# Patient Record
Sex: Female | Born: 1935 | Race: White | Hispanic: No | Marital: Married | Smoking: Never smoker
Health system: Southern US, Community
[De-identification: ages and names within clinical notes are randomized; demographics above are authoritative.]

## PROBLEM LIST (undated history)

## (undated) DIAGNOSIS — IMO0001 Reserved for inherently not codable concepts without codable children: Secondary | ICD-10-CM

## (undated) DIAGNOSIS — K5790 Diverticulosis of intestine, part unspecified, without perforation or abscess without bleeding: Secondary | ICD-10-CM

## (undated) DIAGNOSIS — H269 Unspecified cataract: Secondary | ICD-10-CM

## (undated) DIAGNOSIS — E785 Hyperlipidemia, unspecified: Secondary | ICD-10-CM

## (undated) DIAGNOSIS — K649 Unspecified hemorrhoids: Secondary | ICD-10-CM

## (undated) DIAGNOSIS — F419 Anxiety disorder, unspecified: Secondary | ICD-10-CM

## (undated) DIAGNOSIS — E039 Hypothyroidism, unspecified: Secondary | ICD-10-CM

## (undated) DIAGNOSIS — K219 Gastro-esophageal reflux disease without esophagitis: Secondary | ICD-10-CM

## (undated) HISTORY — DX: Unspecified hemorrhoids: K64.9

## (undated) HISTORY — DX: Reserved for inherently not codable concepts without codable children: IMO0001

## (undated) HISTORY — DX: Unspecified cataract: H26.9

## (undated) HISTORY — DX: Diverticulosis of intestine, part unspecified, without perforation or abscess without bleeding: K57.90

## (undated) HISTORY — DX: Anxiety disorder, unspecified: F41.9

## (undated) HISTORY — DX: Hyperlipidemia, unspecified: E78.5

## (undated) HISTORY — PX: TOTAL ABDOMINAL HYSTERECTOMY: SHX209

## (undated) HISTORY — DX: Hypothyroidism, unspecified: E03.9

## (undated) HISTORY — PX: BILATERAL SALPINGOOPHORECTOMY: SHX1223

## (undated) HISTORY — DX: Gastro-esophageal reflux disease without esophagitis: K21.9

## (undated) HISTORY — PX: APPENDECTOMY: SHX54

---

## 1998-07-04 ENCOUNTER — Other Ambulatory Visit: Admission: RE | Admit: 1998-07-04 | Discharge: 1998-07-04 | Payer: Self-pay | Admitting: *Deleted

## 1999-10-25 ENCOUNTER — Encounter: Admission: RE | Admit: 1999-10-25 | Discharge: 1999-10-25 | Payer: Self-pay | Admitting: Obstetrics and Gynecology

## 1999-10-25 ENCOUNTER — Encounter: Payer: Self-pay | Admitting: Obstetrics and Gynecology

## 1999-11-22 ENCOUNTER — Other Ambulatory Visit: Admission: RE | Admit: 1999-11-22 | Discharge: 1999-11-22 | Payer: Self-pay | Admitting: Obstetrics and Gynecology

## 2000-02-20 ENCOUNTER — Encounter (INDEPENDENT_AMBULATORY_CARE_PROVIDER_SITE_OTHER): Payer: Self-pay | Admitting: *Deleted

## 2000-02-20 ENCOUNTER — Ambulatory Visit (HOSPITAL_BASED_OUTPATIENT_CLINIC_OR_DEPARTMENT_OTHER): Admission: RE | Admit: 2000-02-20 | Discharge: 2000-02-20 | Payer: Self-pay | Admitting: General Surgery

## 2000-08-12 ENCOUNTER — Encounter (INDEPENDENT_AMBULATORY_CARE_PROVIDER_SITE_OTHER): Payer: Self-pay | Admitting: Specialist

## 2000-08-12 ENCOUNTER — Inpatient Hospital Stay (HOSPITAL_COMMUNITY): Admission: RE | Admit: 2000-08-12 | Discharge: 2000-08-15 | Payer: Self-pay | Admitting: Obstetrics and Gynecology

## 2001-01-29 ENCOUNTER — Encounter: Payer: Self-pay | Admitting: Obstetrics and Gynecology

## 2001-01-29 ENCOUNTER — Encounter: Admission: RE | Admit: 2001-01-29 | Discharge: 2001-01-29 | Payer: Self-pay | Admitting: Obstetrics and Gynecology

## 2001-02-07 ENCOUNTER — Other Ambulatory Visit: Admission: RE | Admit: 2001-02-07 | Discharge: 2001-02-07 | Payer: Self-pay | Admitting: Obstetrics and Gynecology

## 2002-01-30 ENCOUNTER — Encounter: Payer: Self-pay | Admitting: Obstetrics and Gynecology

## 2002-01-30 ENCOUNTER — Encounter: Admission: RE | Admit: 2002-01-30 | Discharge: 2002-01-30 | Payer: Self-pay | Admitting: Obstetrics and Gynecology

## 2002-03-10 ENCOUNTER — Other Ambulatory Visit: Admission: RE | Admit: 2002-03-10 | Discharge: 2002-03-10 | Payer: Self-pay | Admitting: Obstetrics and Gynecology

## 2002-12-18 ENCOUNTER — Ambulatory Visit (HOSPITAL_COMMUNITY): Admission: RE | Admit: 2002-12-18 | Discharge: 2002-12-18 | Payer: Self-pay | Admitting: Internal Medicine

## 2004-07-14 ENCOUNTER — Encounter: Admission: RE | Admit: 2004-07-14 | Discharge: 2004-07-14 | Payer: Self-pay | Admitting: Internal Medicine

## 2004-07-31 ENCOUNTER — Ambulatory Visit: Payer: Self-pay | Admitting: Internal Medicine

## 2004-08-14 ENCOUNTER — Ambulatory Visit: Payer: Self-pay | Admitting: Internal Medicine

## 2004-10-25 ENCOUNTER — Other Ambulatory Visit: Admission: RE | Admit: 2004-10-25 | Discharge: 2004-10-25 | Payer: Self-pay | Admitting: Obstetrics and Gynecology

## 2004-11-01 ENCOUNTER — Encounter: Admission: RE | Admit: 2004-11-01 | Discharge: 2004-11-01 | Payer: Self-pay | Admitting: Obstetrics and Gynecology

## 2005-06-01 ENCOUNTER — Emergency Department (HOSPITAL_COMMUNITY): Admission: EM | Admit: 2005-06-01 | Discharge: 2005-06-02 | Payer: Self-pay | Admitting: Emergency Medicine

## 2005-06-11 ENCOUNTER — Ambulatory Visit: Payer: Self-pay | Admitting: Internal Medicine

## 2005-06-21 ENCOUNTER — Ambulatory Visit: Payer: Self-pay | Admitting: Internal Medicine

## 2005-10-08 ENCOUNTER — Ambulatory Visit: Payer: Self-pay

## 2007-03-19 ENCOUNTER — Encounter: Admission: RE | Admit: 2007-03-19 | Discharge: 2007-03-19 | Payer: Self-pay | Admitting: Obstetrics and Gynecology

## 2007-03-20 ENCOUNTER — Other Ambulatory Visit: Admission: RE | Admit: 2007-03-20 | Discharge: 2007-03-20 | Payer: Self-pay | Admitting: Obstetrics and Gynecology

## 2007-08-14 ENCOUNTER — Ambulatory Visit: Payer: Self-pay | Admitting: Internal Medicine

## 2007-09-11 ENCOUNTER — Ambulatory Visit: Payer: Self-pay | Admitting: Internal Medicine

## 2007-09-11 LAB — CONVERTED CEMR LAB
AST: 26 units/L (ref 0–37)
Albumin: 4.1 g/dL (ref 3.5–5.2)
Amylase: 85 units/L (ref 27–131)
Bacteria, UA: NEGATIVE
Basophils Relative: 0.6 % (ref 0.0–1.0)
Bilirubin Urine: NEGATIVE
CO2: 30 meq/L (ref 19–32)
Chloride: 105 meq/L (ref 96–112)
Creatinine, Ser: 1.2 mg/dL (ref 0.4–1.2)
Crystals: NEGATIVE
Glucose, Bld: 97 mg/dL (ref 70–99)
HCT: 41.8 % (ref 36.0–46.0)
Hemoglobin: 13.5 g/dL (ref 12.0–15.0)
Lymphocytes Relative: 32.9 % (ref 12.0–46.0)
MCHC: 32.3 g/dL (ref 30.0–36.0)
Monocytes Absolute: 0.4 10*3/uL (ref 0.2–0.7)
Neutro Abs: 2.6 10*3/uL (ref 1.4–7.7)
Neutrophils Relative %: 55.5 % (ref 43.0–77.0)
Nitrite: NEGATIVE
RDW: 12.9 % (ref 11.5–14.6)
Sodium: 140 meq/L (ref 135–145)
Total Bilirubin: 1 mg/dL (ref 0.3–1.2)
Total Protein, Urine: NEGATIVE mg/dL
Total Protein: 7 g/dL (ref 6.0–8.3)
Urobilinogen, UA: 0.2 (ref 0.0–1.0)

## 2007-09-26 ENCOUNTER — Ambulatory Visit: Payer: Self-pay

## 2008-10-11 ENCOUNTER — Encounter: Payer: Self-pay | Admitting: Obstetrics and Gynecology

## 2008-10-11 ENCOUNTER — Ambulatory Visit: Payer: Self-pay | Admitting: Obstetrics and Gynecology

## 2008-10-11 ENCOUNTER — Other Ambulatory Visit: Admission: RE | Admit: 2008-10-11 | Discharge: 2008-10-11 | Payer: Self-pay | Admitting: Obstetrics and Gynecology

## 2008-11-02 ENCOUNTER — Ambulatory Visit: Payer: Self-pay | Admitting: Obstetrics and Gynecology

## 2009-09-01 ENCOUNTER — Emergency Department (HOSPITAL_COMMUNITY)
Admission: EM | Admit: 2009-09-01 | Discharge: 2009-09-02 | Payer: Self-pay | Source: Home / Self Care | Admitting: Emergency Medicine

## 2010-08-05 ENCOUNTER — Encounter: Payer: Self-pay | Admitting: Internal Medicine

## 2010-10-05 LAB — POCT I-STAT, CHEM 8
BUN: 28 mg/dL — ABNORMAL HIGH (ref 6–23)
Calcium, Ion: 1.18 mmol/L (ref 1.12–1.32)
Chloride: 106 mEq/L (ref 96–112)
Glucose, Bld: 100 mg/dL — ABNORMAL HIGH (ref 70–99)

## 2010-10-05 LAB — DIFFERENTIAL
Basophils Absolute: 0 10*3/uL (ref 0.0–0.1)
Basophils Relative: 0 % (ref 0–1)
Eosinophils Relative: 3 % (ref 0–5)
Monocytes Absolute: 0.6 10*3/uL (ref 0.1–1.0)
Neutro Abs: 4.8 10*3/uL (ref 1.7–7.7)

## 2010-10-05 LAB — CBC
Hemoglobin: 12.9 g/dL (ref 12.0–15.0)
MCHC: 33.5 g/dL (ref 30.0–36.0)
Platelets: 219 10*3/uL (ref 150–400)
RDW: 14.2 % (ref 11.5–15.5)

## 2010-10-05 LAB — POCT CARDIAC MARKERS: CKMB, poc: 2.6 ng/mL (ref 1.0–8.0)

## 2010-11-28 NOTE — Assessment & Plan Note (Signed)
[facility]                         GASTROENTEROLOGY OFFICE NOTE   KEA, CALLAN                     MRN:          [id number]  DATE:08/14/2007                            DOB:          February 04, 1933    REFERRING PHYSICIAN:  Reuel Boom L. Eda Paschal, M.D.   REASON FOR CONSULTATION:  Abdominal discomfort.   HISTORY OF PRESENT ILLNESS:  This is a 77 year old female with a history  of hypothyroidism and diverticulosis, who is referred through the  courtesy of Dr. Eda Paschal regarding abdominal complaints. The patient  was evaluated in November of 2006 for abdominal complaints. See that  dictation for details. At that time, her symptoms were felt to be  anxiety related. Abdominal ultrasound was obtained and returned normal.  She has not been seen since. She has undergone prior colonoscopy in  2006. This revealed diverticulosis and hemorrhoids. At this time, she  reports to me a several month history of a fullness-like sensation in  the abdomen. She notices it in the upper abdomen and then lower abdomen,  particularly when standing. She states it feels like prolapse. However,  she does describe true prolapse. Over the past few weeks, she has  noticed symptoms a couple of times per week. They generally last all  day. She cannot identify any exacerbating or relieving factors such as  meals, defecation, or change in both position. She does feel that  symptoms may be less noticeable after activity, such as tennis. She has  had some weight gain. She denies nausea, vomiting, dysphagia,  constipation, diarrhea, melena or hematochezia. No fevers. The  discomfort does not radiate into the back or groin. No bladder  complaints. She does mention to me that she has been having some stress,  some difficulty sleeping. 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  However, after falling to sleep, no abdominal complaints.   PAST MEDICAL HISTORY:  Hypothyroidism.   PAST  SURGICAL HISTORY:  1. Hysterectomy.  2. Appendectomy.   ALLERGIES:  SULFA.   CURRENT MEDICATIONS:  Synthroid 0.05 mg daily, multivitamin,  chondroitin, and Ambien p.r.n.   FAMILY HISTORY:  Negative for gastrointestinal malignancy.   SOCIAL HISTORY:  The patient is married with 3 children. She lives with  her husband. She has a Chief Operating Officer degree in art history and has worked  part-time as an Network engineer. She does not smoke and occasionally  uses alcohol.   PHYSICAL EXAMINATION:  GENERAL:  A well appearing female in no acute  distress. Alert and oriented.  VITAL SIGNS:  Blood pressure 100/60, heart rate 88, weight 126.8 pounds.  No change from November 2006.  HEENT:  Sclerae anicteric. Conjunctivae pink. Oral mucosa intact. No  thrush.  LUNGS:  Clear.  HEART:  Regular.  ABDOMEN:  Soft with complaints of tenderness on the abdominal wall to  palpation in the mid portion of the abdomen throughout. No mass felt.  Good bowel sounds heard.  EXTREMITIES:  Without edema.   IMPRESSION:  This is a 77 year old female who presents with vague  abdominal discomfort. I suspect her symptoms are functional and  secondary to stress, as was the case  previously. However, we should  pursue some additional workup to be certain as well as be able to  provide some level of reassurance.   RECOMMENDATIONS:  1. Screening laboratories toady including CBC, comprehensive metabolic      panel, amylase and lipase.  2. Urinalysis.  3. Upper endoscopy to exclude ulcer disease. The nature of the      procedure of the risks, benefits, and alternatives have been      reviewed. She understood and agreed to proceed.  4. Ongoing general medical care with Dr. Jacky Kindle and gynecologic care      with Dr. Eda Paschal.     Wilhemina Bonito. Marina Goodell, MD  Electronically Signed    JNP/MedQ  DD: 08/14/2007  DT: 08/15/2007  Job #: [id number]   cc:   Geoffry Paradise, M.D.

## 2010-12-01 NOTE — H&P (Signed)
[facility]  Patient:    Ruth Porter, Ruth Porter                       MRN: [id number] Attending:  Rande Brunt. Eda Paschal, M.D.                         History and Physical  CHIEF COMPLAINT:  Symptomatic fibroids.  HISTORY OF PRESENT ILLNESS:  Patient is a 77 year old gravida 3, para 3, AB0 who is getting progressively worse symptoms from her fibroids that are secondary to hormonal replacement stimulation.  They consist of menorrhagia, severe dysmenorrhea, and pain on her right lower quadrant that radiates to her legs.  Ultrasound has confirmed multiple myomas, the largest of which is almost 5 cm.  We have tried to alter hormonal replacement therapy but this has not been successful.  Patient is doing well on hormones and when she tried to discontinue the hormones so she would not have to put up with the above she felt terrible off of them and therefore she wants to stay on her estrogen both for symptom relief as well as prevention.  As a result of all of the above she now enters the hospital for a vaginal hysterectomy.  She would like to have a bilateral salpingo-oophorectomy because of her age and I think that is appropriate.  She would like me to take out her ovaries even if that requires an incision.  PAST MEDICAL HISTORY:  Patient is hypothyroid on Synthroid.  She is also on hormonal replacement therapy.  ALLERGIES:  None.  PAST SURGICAL HISTORY:  Appendectomy at the time of exploratory laparotomy in 1963.  SOCIAL HISTORY:  She is a nonsmoker.  She drinks alcohol in small doses.  REVIEW OF SYSTEMS:  HEENT:  Negative.  CARDIAC:  Negative.  RESPIRATORY: Negative.  GI:  Negative.  GU:  Negative.  NEUROMUSCULAR:  Negative. ENDOCRINE:  History of hypothyroidism.  All else is negative.  FAMILY HISTORY:  Grandmother had uterine cancer.  Mother and grandfather both have coronary artery disease.  PHYSICAL EXAMINATION:  GENERAL:  Patient is a well-developed,  well-nourished female in no acute distress.  VITAL SIGNS:  Blood pressure 130/70, pulse 80 and regular, respirations 16 and nonlabored.  She is afebrile.  HEENT:  All within normal limits.  NECK:  Supple.  Trachea in the midline.  Thyroid is not enlarged.  LUNGS:  Clear to P&A.  HEART:  No thrills, heaves, or murmurs.  BREASTS:  No masses.  ABDOMEN:  Soft without guarding, rebound, or masses.  PELVIC:  External and vaginal is within normal limits.  Cervix is clean.  Pap shows no atypia.  Uterus is enlarged by myomas to 9 weeks size.  Adnexa is palpably normal.  RECTAL:  Negative.  IMPRESSION:  Symptomatic fibroids.  PLAN:  Vaginal hysterectomy, bilateral salpingo-oophorectomy. DD:  08/12/00 TD:  08/12/00 Job: [id number]

## 2010-12-01 NOTE — Discharge Summary (Signed)
[facility]  Patient:    Ruth Porter, Ruth Porter                   MRN: [id number] Adm. Date:  [id number] Disc. Date: 08/15/00 Attending:  Sharon Mt                           Discharge Summary  HISTORY OF PRESENT ILLNESS:  The patient is a 77 year old female with fibroids who continued to have postmenopausal bleeding and pain and, therefore, entered the hospital for surgery.  HOSPITAL COURSE:  On the day of admission she underwent a vaginal hysterectomy, bilateral salpingo-oophorectomy.  She had an ileus postoperatively that finally responded to IVs and restriction of fluids.  By the third postoperative day she was ready for discharge.  DISCHARGE MEDICATIONS: 1. Tylox for pain relief. 2. Ferrous sulfate 325 mg daily for iron replacement.  FOLLOW-UP:  She will be seen in the office in three weeks.  ACTIVITY:  Ambulatory.  DIET:  Regular.  PATHOLOGY:  Final pathology report revealed uterus with leiomyoma intramural, adenomyosis, simplex and complex hyperplasia without atypia.  Ovaries and fallopian tubes without any pathological diagnosis.  CONDITION ON DISCHARGE:  Improved.  DISCHARGE DIAGNOSES: 1. Persistent postmenopausal bleeding. 2. Leiomyomata uteri. 3. Adenomyosis. 4. Simplex and complex hyperplasia without atypia of the endometrium.  OPERATIONS:  Vaginal hysterectomy, bilateral salpingo-oophorectomy.DD: 08/15/00 TD:  08/15/00 Job: [id number]

## 2010-12-01 NOTE — Op Note (Signed)
[facility]  Patient:    Ruth Porter, Ruth Porter                  MRN: [id number] Proc. Date: 08/12/00 Attending:  Rande Brunt. Eda Paschal, M.D.                           Operative Report  PREOPERATIVE DIAGNOSIS:  Symptomatic fibroids.  POSTOPERATIVE DIAGNOSIS:  Symptomatic fibroids.  OPERATION PERFORMED:  Vaginal hysterectomy, bilateral salpingo-oophorectomy.  SURGEON:  Dr. Eda Paschal.  FIRST ASSISTANT:  Dr. Lily Peer.  ANESTHESIA:  General endotracheal.  FINDINGS:  The patients uterus was enlarged by multiple myomas to 9 and 10 week size, ovaries fallopian tubes and pelvic peritoneum were free of any disease.  DESCRIPTION OF PROCEDURE:  After adequate general endotracheal anesthesia, the patient was placed in the dorsal lithotomy position, prepped and draped in the usual sterile manner. A 1:200,000 solution of epinephrine was injected around the cervix and 0.5% xylocaine. A 360 degree incision was made around the cervix. The bladder was mobilized superiorly as was the posterior peritoneum. Initially it was difficult to get into the vesicouterine fold to peritoneum because the patient had a small myoma on the serosal surface of the uterus in the midline anteriorly. Finally this area could be dissected above without entering the bladder. The vesicouterine fold to peritoneum was sharply entered. The posterior peritoneum was sharply entered. The uterosacral ligaments were clamped and then they were shortened and they were sutured to the vaginal cuff laterally for good vault support. The cardinal ligaments, uterine arteries, and most of the broad ligaments were clamped, cut and suture ligated. Suture material for all the above mentioned pedicles was #1 chromic catgut. The uterus was partially delivered. It required both morcellation and myomectomies in order to shrink the uterus enough to get it out through the introitus but it finally was accomplished and  then the balance of the broad ligaments, utero-ovarian ligaments, round ligaments, and fallopian tubes were clamped, cut and suture ligated with #1 chromic catgut. All the pieces of the myoma and the uterus were sent to pathology for tissue diagnosis. The adnexa were then identified and the infundibulopelvic ligaments were clamped, cut, and doubly suture ligated with #1 chromic catgut. They were also sent to pathology for tissue diagnosis. At this point, we encountered some fairly brisk bleeding. We were concerned about the pedicles. As the surgery proceeded, it turned out that the pedicles were all intact and it was all just vaginal cuff bleeding. Using interrupted figure-of-eights of #1 chromic catgut, the peritoneum was suture ligated in about 4 or 5 spots to stop the bleeding and then the remainder of the vaginal cuff was whip stitched with a running locking #0 Vicryl. A modified McCall enterocele suture was placed with 2-0 Vicryl. At this point, the bleeding had been completely controlled and it stopped. The cuff was closed with figure-of-eights of #1 chromic catgut. At the termination of the procedure, a Foley catheter was inserted and draining clear urine. Estimated blood loss was 450 cc with none replaced. The patient tolerated the procedure well and left the operating room in satisfactory condition. DD:  08/12/00 TD:  08/12/00 Job: [id number]

## 2013-06-18 ENCOUNTER — Other Ambulatory Visit: Payer: Self-pay

## 2014-09-21 ENCOUNTER — Ambulatory Visit: Payer: Self-pay | Admitting: Internal Medicine

## 2015-06-17 ENCOUNTER — Encounter: Payer: Self-pay | Admitting: Internal Medicine

## 2019-08-09 ENCOUNTER — Ambulatory Visit: Payer: Self-pay

## 2020-12-03 ENCOUNTER — Encounter (HOSPITAL_COMMUNITY): Payer: Self-pay

## 2020-12-03 ENCOUNTER — Other Ambulatory Visit: Payer: Self-pay

## 2020-12-03 ENCOUNTER — Ambulatory Visit (HOSPITAL_COMMUNITY)
Admission: EM | Admit: 2020-12-03 | Discharge: 2020-12-03 | Disposition: A | Discharge status: Home / Self Care | Payer: Medicare (Managed Care)

## 2020-12-03 DIAGNOSIS — R531 Weakness: Secondary | ICD-10-CM | POA: Diagnosis not present

## 2020-12-03 DIAGNOSIS — R079 Chest pain, unspecified: Secondary | ICD-10-CM

## 2020-12-03 DIAGNOSIS — R42 Dizziness and giddiness: Secondary | ICD-10-CM | POA: Diagnosis not present

## 2020-12-03 DIAGNOSIS — R6884 Jaw pain: Secondary | ICD-10-CM | POA: Diagnosis not present

## 2020-12-03 DIAGNOSIS — Z751 Person awaiting admission to adequate facility elsewhere: Secondary | ICD-10-CM

## 2020-12-03 NOTE — ED Notes (Signed)
Patient is being discharged from the Urgent Care and sent to the Emergency Department via POV. Per Fleet Contras, [place], patient is in need of higher level of care due to complaint/tia. Patient is aware and verbalizes understanding of plan of care.  Vitals:   12/03/20 1619  BP: 135/87  Pulse: 78  Resp: 18  Temp: 98.2 F (36.8 C)  SpO2: 94%

## 2020-12-03 NOTE — ED Provider Notes (Signed)
Patient sent to ED for further evaluation given the nature of her symptoms.  She will have someone transport her via private vehicle.  She declines EMS transport and is hemodynamically stable for private vehicle transport.   Particia Nearing, [place] 12/03/20 1652

## 2020-12-03 NOTE — ED Triage Notes (Signed)
Pt reports that she totally lost strength in her legs at 0830.  States legs completely went out, had no strength at that time and caught self in a chair. States symptoms then resolved on their own in 10 minutes. Also reports one or two dizzy spells over the last 6 months with most recent episode being more than 2 weeks ago. Pt denies any weakness or dizziness at this time.   States was having slight chest pain with right side jaw pain this morning. Pt reports that is on amoxicillin for dental issue on this side however. Pt denies chest pain at this time.  Pt also reports being sick with a cold last week but that she is feeling better.  Reports she has upcoming dental surgery on Tuesday.

## 2020-12-03 NOTE — ED Notes (Signed)
Discharged patient from epic only/this nurse was not involved in patient's care

## 2020-12-14 ENCOUNTER — Emergency Department (HOSPITAL_BASED_OUTPATIENT_CLINIC_OR_DEPARTMENT_OTHER): Payer: Medicare (Managed Care)

## 2020-12-14 ENCOUNTER — Encounter (HOSPITAL_BASED_OUTPATIENT_CLINIC_OR_DEPARTMENT_OTHER): Payer: Self-pay | Admitting: Emergency Medicine

## 2020-12-14 ENCOUNTER — Other Ambulatory Visit: Payer: Self-pay

## 2020-12-14 ENCOUNTER — Emergency Department (HOSPITAL_BASED_OUTPATIENT_CLINIC_OR_DEPARTMENT_OTHER)
Admission: EM | Admit: 2020-12-14 | Discharge: 2020-12-14 | Disposition: A | Discharge status: Home / Self Care | Payer: Medicare (Managed Care) | Attending: Emergency Medicine | Admitting: Emergency Medicine

## 2020-12-14 DIAGNOSIS — Z20822 Contact with and (suspected) exposure to covid-19: Secondary | ICD-10-CM | POA: Insufficient documentation

## 2020-12-14 DIAGNOSIS — Z79899 Other long term (current) drug therapy: Secondary | ICD-10-CM | POA: Diagnosis not present

## 2020-12-14 DIAGNOSIS — R531 Weakness: Secondary | ICD-10-CM | POA: Diagnosis present

## 2020-12-14 DIAGNOSIS — E039 Hypothyroidism, unspecified: Secondary | ICD-10-CM | POA: Insufficient documentation

## 2020-12-14 DIAGNOSIS — R5383 Other fatigue: Secondary | ICD-10-CM | POA: Insufficient documentation

## 2020-12-14 DIAGNOSIS — R42 Dizziness and giddiness: Secondary | ICD-10-CM | POA: Insufficient documentation

## 2020-12-14 LAB — CBC WITH DIFFERENTIAL/PLATELET
Abs Immature Granulocytes: 0.01 10*3/uL (ref 0.00–0.07)
Basophils Absolute: 0 10*3/uL (ref 0.0–0.1)
Basophils Relative: 1 %
Eosinophils Absolute: 0.2 10*3/uL (ref 0.0–0.5)
Eosinophils Relative: 3 %
HCT: 38.4 % (ref 36.0–46.0)
Hemoglobin: 12.4 g/dL (ref 12.0–15.0)
Immature Granulocytes: 0 %
Lymphocytes Relative: 24 %
Lymphs Abs: 1.3 10*3/uL (ref 0.7–4.0)
MCH: 27.1 pg (ref 26.0–34.0)
MCHC: 32.3 g/dL (ref 30.0–36.0)
MCV: 83.8 fL (ref 80.0–100.0)
Monocytes Absolute: 0.5 10*3/uL (ref 0.1–1.0)
Monocytes Relative: 9 %
Neutro Abs: 3.5 10*3/uL (ref 1.7–7.7)
Neutrophils Relative %: 63 %
Platelets: 236 10*3/uL (ref 150–400)
RBC: 4.58 MIL/uL (ref 3.87–5.11)
RDW: 14.5 % (ref 11.5–15.5)
WBC: 5.4 10*3/uL (ref 4.0–10.5)
nRBC: 0 % (ref 0.0–0.2)

## 2020-12-14 LAB — COMPREHENSIVE METABOLIC PANEL
ALT: 13 U/L (ref 0–44)
AST: 19 U/L (ref 15–41)
Albumin: 3.6 g/dL (ref 3.5–5.0)
Alkaline Phosphatase: 73 U/L (ref 38–126)
Anion gap: 8 (ref 5–15)
BUN: 19 mg/dL (ref 8–23)
CO2: 26 mmol/L (ref 22–32)
Calcium: 8.6 mg/dL — ABNORMAL LOW (ref 8.9–10.3)
Chloride: 105 mmol/L (ref 98–111)
Creatinine, Ser: 0.92 mg/dL (ref 0.44–1.00)
GFR, Estimated: >60 mL/min (ref >60)
Glucose, Bld: 90 mg/dL (ref 70–99)
Potassium: 3.7 mmol/L (ref 3.5–5.1)
Sodium: 139 mmol/L (ref 135–145)
Total Bilirubin: 0.4 mg/dL (ref 0.3–1.2)
Total Protein: 5.8 g/dL — ABNORMAL LOW (ref 6.5–8.1)

## 2020-12-14 LAB — URINALYSIS, ROUTINE W REFLEX MICROSCOPIC
Bilirubin Urine: NEGATIVE
Glucose, UA: NEGATIVE mg/dL
Hgb urine dipstick: NEGATIVE
Ketones, ur: NEGATIVE mg/dL
Leukocytes,Ua: NEGATIVE
Nitrite: NEGATIVE
Protein, ur: NEGATIVE mg/dL
Specific Gravity, Urine: 1.01 (ref 1.005–1.030)
pH: 5 (ref 5.0–8.0)

## 2020-12-14 LAB — MAGNESIUM: Magnesium: 1.9 mg/dL (ref 1.7–2.4)

## 2020-12-14 LAB — RESP PANEL BY RT-PCR (FLU A&B, COVID) ARPGX2
Influenza A by PCR: NEGATIVE
Influenza B by PCR: NEGATIVE
SARS Coronavirus 2 by RT PCR: NEGATIVE

## 2020-12-14 LAB — TROPONIN I (HIGH SENSITIVITY): Troponin I (High Sensitivity): 4 ng/L (ref <18)

## 2020-12-14 NOTE — Discharge Instructions (Signed)
We talked about the importance of following up with a cardiologist this week.   Their office will reach out to you to arrange this follow up. I advised that you start taking 81 mg aspirin every day, which you said you have at home.   You were diagnosed with chest pain today.  This is a non-specific diagnosis, but your provider did not feel this was a life-threatening condition at this time.  It is not uncommon for patients to leave the emergency department without a specific diagnosis, as our tests are diagnosed to recognize life-threatening conditions only.  It is important to remember that your workup today was not a complete medical workup.  You may still have a serious medical attention that needs follow up care with a specialist. If your provider referred you to see a specialist, it is VERY important that you call to set up an appointment with them.    It is also important that you speak to your primary care provider in 1-2 days about this visit to the ER.  Your PCP may want to see you in the office, or else they may want you to follow up with the specialist.  SEEK IMMEDIATE MEDICAL ATTENTION IF:  You have severe chest pain, especially if the pain is crushing or pressure-like and spreads to the arms, back, neck, or jaw, or if you have sweating, nausea (feeling sick to your stomach), or shortness of breath. THIS IS AN EMERGENCY. Don't wait to see if the pain will go away. Get medical help at once. Call 911 or 0 (operator). DO NOT drive yourself to the hospital.   Your chest pain gets worse and does not go away with rest.  You have an attack of chest pain lasting longer than usual, despite rest and treatment with the medications your caregiver has prescribed.  You wake from sleep with chest pain or shortness of breath.  You feel dizzy or faint.  You have chest pain not typical of your usual pain for which you originally saw your caregiver.

## 2020-12-14 NOTE — ED Provider Notes (Signed)
[facility] EMERGENCY DEPT Provider Note   CSN: [id number] Arrival date & time: 12/14/20  1614     History Chief Complaint  Patient presents with  . Dizziness    Ruth Porter is a 87 y.o. female present emergency department generalized fatigue.  Patient reports that she has had several weeks of intermittent symptoms of fatigue, weakness, "feeling bad."  Her symptoms tend to wax and wane.  She went to an UC 2 weeks ago, and they advised an ED visit for blood tests, but she opted not to do this due to long wait times.  She feels tired today.  Mild vertigo (hx of this) and some lightheadedness with standing.    Denies dysuria, hematuria, fever, cough, congestion, diarrhea, chest pain, chest pressure, SOB.  Denies personal hx of MI or cardiac disease, or hx of smoking, HLD, diabetes.  She has a family hx of heart disease.  Here with her daughter at bedside  HPI     Past Medical History:  Diagnosis Date  . Anxiety   . Cataract    right  . Diverticulosis   . Hemorrhoids   . Hyperlipemia   . Hypothyroidism   . Reflux     There are no problems to display for this patient.   Past Surgical History:  Procedure Laterality Date  . APPENDECTOMY    . BILATERAL SALPINGOOPHORECTOMY    . TOTAL ABDOMINAL HYSTERECTOMY       OB History   No obstetric history on file.     Family History  Problem Relation Age of Onset  . Heart attack Mother   . Stroke Mother     Social History   Tobacco Use  . Smoking status: Never Smoker  . Smokeless tobacco: Never Used  Substance Use Topics  . Alcohol use: Not Currently  . Drug use: Not Currently    Home Medications Prior to Admission medications   Medication Sig Start Date End Date Taking? Authorizing Provider  cholecalciferol (VITAMIN D3) 25 MCG (1000 UNIT) tablet Take 5,000 Units by mouth daily.    [provider]  dexlansoprazole (DEXILANT) 60 MG capsule Take 60 mg by mouth daily.    [provider]  escitalopram (LEXAPRO) 5 MG tablet Take 5 mg by mouth daily.    [provider]  levothyroxine (SYNTHROID) 25 MCG tablet Take 25 mcg by mouth daily before breakfast.    [provider]  Multiple Vitamins-Minerals (CENTRUM ULTRA WOMENS PO) Take by mouth.    [provider]  Multiple Vitamins-Minerals (PRESERVISION/LUTEIN PO) Take by mouth.    [provider]    Allergies    Sulfa antibiotics  Review of Systems   Review of Systems  Constitutional: Positive for fatigue. Negative for chills and fever.  HENT: Negative for ear pain and sore throat.   Eyes: Negative for pain and visual disturbance.  Respiratory: Negative for cough and shortness of breath.   Cardiovascular: Negative for chest pain and palpitations.  Gastrointestinal: Negative for abdominal pain and vomiting.  Genitourinary: Negative for dysuria and hematuria.  Musculoskeletal: Negative for arthralgias and back pain.  Skin: Negative for color change and rash.  Neurological: Positive for light-headedness. Negative for syncope.  All other systems reviewed and are negative.   Physical Exam Updated Vital Signs BP (!) 163/91 (BP Location: Right Arm)   Pulse 66   Temp 99 F (37.2 C) (Oral)   Resp 19   Ht 5\' 3"  (1.6 m)   Wt 55.8 kg  SpO2 99%   BMI 21.79 kg/m   Physical Exam Constitutional:      General: She is not in acute distress. HENT:     Head: Normocephalic and atraumatic.  Eyes:     Conjunctiva/sclera: Conjunctivae normal.     Pupils: Pupils are equal, round, and reactive to light.  Cardiovascular:     Rate and Rhythm: Normal rate and regular rhythm.  Pulmonary:     Effort: Pulmonary effort is normal. No respiratory distress.  Abdominal:     General: There is no distension.     Tenderness: There is no abdominal tenderness.  Skin:    General: Skin is warm and dry.  Neurological:     General: No focal deficit present.     Mental Status: She is alert.  Mental status is at baseline.     GCS: GCS eye subscore is 4. GCS verbal subscore is 5. GCS motor subscore is 6.     Cranial Nerves: Cranial nerves are intact.     Sensory: Sensation is intact.     Motor: Motor function is intact.     Coordination: Coordination is intact.     Gait: Gait is intact.  Psychiatric:        Mood and Affect: Mood normal.        Behavior: Behavior normal.     ED Results / Procedures / Treatments   Labs (all labs ordered are listed, but only abnormal results are displayed) Labs Reviewed  COMPREHENSIVE METABOLIC PANEL - Abnormal; Notable for the following components:      Result Value   Calcium 8.6 (*)    Total Protein 5.8 (*)    All other components within normal limits  RESP PANEL BY RT-PCR (FLU A&B, COVID) ARPGX2  CBC WITH DIFFERENTIAL/PLATELET  URINALYSIS, ROUTINE W REFLEX MICROSCOPIC  MAGNESIUM  TROPONIN I (HIGH SENSITIVITY)    EKG EKG Interpretation  Date/Time:  Wednesday December 14 2020 16:30:20 EDT Ventricular Rate:  79 PR Interval:  190 QRS Duration: 93 QT Interval:  369 QTC Calculation: 423 R Axis:   42 Text Interpretation: Sinus rhythm Ventricular premature complex Low voltage, precordial leads No STEMI Confirmed by Alvester Chou [phone]) on 12/14/2020 4:34:52 PM   Radiology DG Chest Portable 1 View  Result Date: 12/14/2020 CLINICAL DATA:  Dizziness EXAM: PORTABLE CHEST 1 VIEW COMPARISON:  None. FINDINGS: Heart and mediastinal contours are within normal limits. No focal opacities or effusions. No acute bony abnormality. IMPRESSION: No active disease. Electronically Signed   By: Charlett Nose M.D.   On: 12/14/2020 17:29    Procedures Procedures   Medications Ordered in ED Medications - No data to display  ED Course  I have reviewed the triage vital signs and the nursing notes.  Pertinent labs & imaging results that were available during my care of the patient were reviewed by me and considered in my medical decision making (see  chart for details).  87 yo female here with nonspecific symptoms for several weeks, primarily fatigue, weakness, and "feeling bad."  No specific pain complaints.  No chest discomfort.  Ddx is broad and would include atypical ACS vs anemia vs dehydration vs infection including covid, UTI, or other vs other  Labs reviewed and unremarkable.  No evidence of anemia, dehydration, ACS, covid, UTI.  WBC normal.  Hgb normal.  ECG reviewed personally with NSR, no acute ischemic findings or significant irregularities. DG chest reviewed - no focal findings  She has a benign neurological exam - no evidence  of stroke at this time.  Additional history provided by daughter at bedside.  Her daughter spoke with their family cardiologist who subsequently called me, and told me her office in [place] would arrange for a first-time visit this week with the patient, and advised that we begin her on 81 mg aspirin daily empirically until further cardiac workup could be obtained.  I think this is reasonable.  I reassessed the patient and she had no new complaints.  She was able to ambulate steadily in the ED.  At this time I do not see a clinical indication for hospitalization, and I believe she is reasonably safe for discharge home.  We discussed fall precautions - she has a walker, and close outpatient follow up.  Both the patient and her daughter were in agreement with this plan and had no further questions for me.  Clinical Course as of 12/14/20 2356  Wed Dec 14, 2020  2951 Patient will have cardiologist appointment at the end of the week.  Okay to discharge [MT]    Clinical Course User Index [MT] Sutter Ahlgren, Kermit Balo, MD    Final Clinical Impression(s) / ED Diagnoses Final diagnoses:  Weakness    Rx / DC Orders ED Discharge Orders    None       Renaye Rakers Kermit Balo, MD 12/14/20 2356

## 2020-12-14 NOTE — ED Triage Notes (Signed)
Pt arrives to ED with c/o dizziness and lightheadedness that's started this morning. Pt reports minor procedure to left eye yesterday that required IV dye and shot to the left eye for macular degeneration.  Pt reports unsteady gait.

## 2020-12-14 NOTE — ED Notes (Signed)
Labs drawn and on hold in lab

## 2020-12-14 NOTE — ED Notes (Signed)
Complains of dizziness weakness and generally "feeling" bad.  States onset two weeks.  Weakness in legs bilaterally but not at present.  Denies SOB or chest pain.

## 2020-12-15 ENCOUNTER — Telehealth: Payer: Self-pay | Admitting: *Deleted

## 2020-12-15 NOTE — Telephone Encounter (Signed)
Message from Dr. Tenny Craw - pt has been at hospital for weakness, needs follow up in cardiology and ok to add in tomorrow afternoon (DOD day).  Called pt.  Left message to call back.  She can be added in at 1:00 pm.  Will await return call.

## 2020-12-16 ENCOUNTER — Encounter: Payer: Self-pay | Admitting: Internal Medicine

## 2020-12-16 ENCOUNTER — Ambulatory Visit: Payer: Medicare (Managed Care) | Admitting: Internal Medicine

## 2020-12-16 ENCOUNTER — Other Ambulatory Visit: Payer: Self-pay

## 2020-12-16 VITALS — BP 122/72 | HR 75 | Ht 63.0 in | Wt 125.6 lb

## 2020-12-16 DIAGNOSIS — R531 Weakness: Secondary | ICD-10-CM | POA: Diagnosis not present

## 2020-12-16 NOTE — Progress Notes (Signed)
Cardiology Office Note   Date:  12/17/2020   ID:  Ruth Porter, Ruth Porter 1932-09-07, MRN [id number]  PCP:  Ruth Paradise, MD  Cardiologist:   Ruth Pates, MD   Pt referred as f/u for weakness   History of Present Illness: Ruth Porter is a 87 y.o. female with hx of  Hypothyroidism.   No known CAD    The pt presents today on f/u from ER visit on 12/14/20  The pt says she was doing OK  Until early May 2022  Says she developed a viral type infection. Was recovering.   On 5/21 she was seen in Urgent care.  That morning she became weak in legs, no strength   Resolved  ALso complained of some chest pressure and R jaw pain  Being treated for dental problem, on amoxicillin. Pt did not go to ED for further eval On 6/1 she went to ED for complaints of fatigue. BP 163/   Noted mild dizziness  Lab work normal (trop, UA).  EKG unremarkable   Pt sent out  Talking to the pt she says she has been very active but says she has gradually slowed.   More acute now. SHe also notes intermitt chest discomfort for years.    Had stress test years ago (medicine given)   Says the test was horrible.    Pt says both mother and sister had signif CAD      Current Meds  Medication Sig  . aspirin EC 81 MG tablet Take 81 mg by mouth daily. Swallow whole.  . Cholecalciferol (VITAMIN D) 125 MCG (5000 UT) CAPS Take 5,000 Units by mouth daily.  Marland Kitchen escitalopram (LEXAPRO) 10 MG tablet Take 0.5 tablets by mouth daily.  Marland Kitchen levothyroxine (SYNTHROID) 50 MCG tablet Take 50 mcg by mouth daily.  . Multiple Vitamins-Minerals (CENTRUM ULTRA WOMENS PO) Take by mouth.  . Multiple Vitamins-Minerals (PRESERVISION/LUTEIN PO) Take by mouth.     Allergies:   Sulfa antibiotics   Past Medical History:  Diagnosis Date  . Anxiety   . Cataract    right  . Diverticulosis   . Hemorrhoids   . Hyperlipemia   . Hypothyroidism   . Reflux     Past Surgical History:  Procedure Laterality Date  . APPENDECTOMY    . BILATERAL  SALPINGOOPHORECTOMY    . TOTAL ABDOMINAL HYSTERECTOMY       Social History:  The patient  reports that she has never smoked. She has never used smokeless tobacco. She reports previous alcohol use. She reports previous drug use.   Family History:  The patient's family history includes Heart attack in her mother; Stroke in her mother.    ROS:  Please see the history of present illness. All other systems are reviewed and  Negative to the above problem except as noted.    PHYSICAL EXAM: VS:  BP 122/72   Pulse 75   Ht 5\' 3"  (1.6 m)   Wt 125 lb 9.6 oz (57 kg)   SpO2 96%   BMI 22.25 kg/m    Orthostatics:  BP 106/88 P 72 (laying);  110/72  P 76 (sitting)   120/70 P 80  (standing)   118/76 P 80 (standing 4 min)  GEN: Well nourished, well developed, in no acute distress  HEENT: normal  Neck: no JVD, carotid bruits, or masses Cardiac: RRR; no murmurs, rubs, or gallops,no edema  Respiratory:  clear to auscultation bilaterally, normal work of breathing GI: soft, nontender, nondistended, +  BS  No hepatomegaly  MS: no deformity Moving all extremities   Skin: warm and dry, no rash Neuro:  Strength and sensation are intact Psych: euthymic mood, full affect   EKG:  EKG is not ordered today.   Lipid Panel No results found for: CHOL, TRIG, HDL, CHOLHDL, VLDL, LDLCALC, LDLDIRECT    Wt Readings from Last 3 Encounters:  12/16/20 125 lb 9.6 oz (57 kg)  12/14/20 123 lb (55.8 kg)      ASSESSMENT AND PLAN:  Pt is an 87 yo with hx of weakness, fatigue    Also intermitt chest discomfort   SYmptoms are not completely typical (chest discomfort not tied to activity)   But, not able to explan otherwise in a 87 yo woman who otherwise is without signif medical problems, used to being activity.   COncern for CAD given Hx of both mother and sister with significant CAD  Reocmm:   WIll check TSH to r/o inadequant replacement therapy. With profound reaction to previous myoview in 2011, will arrange for  CT coronary angiogram to eval coronary anatomy To this point take activity as tolerated  2  Thyroid  Check TSH  3  Lipids   LDL  119  HDL 50  Trig 89   Follow for now      Current medicines are reviewed at length with the patient today.  The patient does not have concerns regarding medicines.  Signed, Ruth Pates, MD  12/17/2020 8:53 AM    [facility] [address] [place], [place], [place]  [address] Phone: [phone]; Fax: [phone]

## 2020-12-16 NOTE — Patient Instructions (Signed)
Medication Instructions:  No changes *If you need a refill on your cardiac medications before your next appointment, please call your pharmacy*   Lab Work: Today: TSH  If you have labs (blood work) drawn today and your tests are completely normal, you will receive your results only by: Marland Kitchen MyChart Message (if you have MyChart) OR . A paper copy in the mail If you have any lab test that is abnormal or we need to change your treatment, we will call you to review the results.   Testing/Procedures: Dr. Tenny Craw recommends cardiac CT angiogram.  We will call you with instructions once appointment is scheduled.   Follow-Up: As needed.  Other Instructions

## 2020-12-17 LAB — TSH: TSH: 1.87 u[IU]/mL (ref 0.450–4.500)

## 2020-12-26 ENCOUNTER — Encounter: Payer: Self-pay | Admitting: *Deleted

## 2020-12-26 ENCOUNTER — Other Ambulatory Visit: Payer: Self-pay | Admitting: *Deleted

## 2020-12-26 DIAGNOSIS — R072 Precordial pain: Secondary | ICD-10-CM

## 2020-12-26 MED ORDER — METOPROLOL TARTRATE 100 MG PO TABS: ORAL_TABLET | ORAL | 0 refills | Status: DC

## 2020-12-26 NOTE — Progress Notes (Signed)
Order placed for cCTA.  Will do instruction letter and send metoprolol to pharmacy.

## 2021-01-10 ENCOUNTER — Telehealth (HOSPITAL_COMMUNITY): Payer: Self-pay | Admitting: Emergency Medicine

## 2021-01-10 ENCOUNTER — Telehealth (HOSPITAL_COMMUNITY): Payer: Self-pay | Admitting: *Deleted

## 2021-01-10 NOTE — Telephone Encounter (Signed)
Attempted to call patient regarding upcoming cardiac CT appointment. °Left message on voicemail with name and callback number °Hanzel Pizzo RN Navigator Cardiac Imaging °[facility] °[phone] Office °[phone] Cell ° °

## 2021-01-10 NOTE — Telephone Encounter (Signed)
Patient returning call regarding upcoming cardiac imaging study; pt verbalizes understanding of appt date/time, parking situation and where to check in, pre-test NPO status and medications ordered, and verified current allergies; name and call back number provided for further questions should they arise  Larey Brick RN Navigator Cardiac Imaging [facility] [phone] office [phone] cell  Patient to take 100mg  metoprolol tartrate 2 hours prior to cardiac CT scan.

## 2021-01-12 ENCOUNTER — Other Ambulatory Visit (HOSPITAL_COMMUNITY): Payer: Self-pay | Admitting: *Deleted

## 2021-01-12 ENCOUNTER — Ambulatory Visit (HOSPITAL_COMMUNITY)
Admission: RE | Admit: 2021-01-12 | Discharge: 2021-01-12 | Disposition: A | Discharge status: Home / Self Care | Payer: Medicare (Managed Care) | Source: Ambulatory Visit | Attending: Internal Medicine | Admitting: Internal Medicine

## 2021-01-12 DIAGNOSIS — R931 Abnormal findings on diagnostic imaging of heart and coronary circulation: Secondary | ICD-10-CM

## 2021-01-12 DIAGNOSIS — R072 Precordial pain: Secondary | ICD-10-CM | POA: Insufficient documentation

## 2021-01-12 DIAGNOSIS — I2510 Atherosclerotic heart disease of native coronary artery without angina pectoris: Secondary | ICD-10-CM

## 2021-01-12 MED ORDER — IOHEXOL 350 MG/ML SOLN
95.0000 mL | Freq: Once | INTRAVENOUS | Status: AC | PRN
  Administered 2021-01-12: 95 mL via INTRAVENOUS

## 2021-01-12 MED ORDER — NITROGLYCERIN 0.4 MG SL SUBL
SUBLINGUAL_TABLET | SUBLINGUAL | Status: AC
  Filled 2021-01-12: qty 1

## 2021-01-12 MED ORDER — NITROGLYCERIN 0.4 MG SL SUBL
0.4000 mg | SUBLINGUAL_TABLET | SUBLINGUAL | Status: DC | PRN
  Administered 2021-01-12: 0.4 mg via SUBLINGUAL

## 2021-01-17 ENCOUNTER — Telehealth: Payer: Self-pay | Admitting: Internal Medicine

## 2021-01-17 DIAGNOSIS — Z79899 Other long term (current) drug therapy: Secondary | ICD-10-CM

## 2021-01-17 DIAGNOSIS — R931 Abnormal findings on diagnostic imaging of heart and coronary circulation: Secondary | ICD-10-CM

## 2021-01-17 NOTE — Telephone Encounter (Signed)
I tried to call pt  Unable to leave message I did leave message with daughter Luvenia Heller) CT scan showed that pt does have coronary artery disease / plaquing  None appears folow limitigin, should not be causing symptoms Recomm 81 mg aspirin enteric coated Crestor 10 mg   F/U lipids inad ASt in 8 wks

## 2021-01-17 NOTE — Telephone Encounter (Signed)
Patient states she is returning a call from today. She states her daughter received the call and assumes it was Dr. Tenny Craw who called her. She states she call: [phone]

## 2021-01-18 MED ORDER — ROSUVASTATIN CALCIUM 10 MG PO TABS: 10.0000 mg | ORAL_TABLET | Freq: Every day | ORAL | 3 refills | Status: DC

## 2021-01-18 NOTE — Telephone Encounter (Signed)
Informed patient of results and verbal understanding expressed. Rx sent to pharmacy. Pt will stop by the office 9/6 for follow up blood work.  She is aware to be fasting. Patient verbalized understanding and agreeable to plan.

## 2021-03-14 ENCOUNTER — Ambulatory Visit (INDEPENDENT_AMBULATORY_CARE_PROVIDER_SITE_OTHER): Payer: Medicare (Managed Care)

## 2021-03-14 ENCOUNTER — Other Ambulatory Visit: Payer: Self-pay

## 2021-03-14 ENCOUNTER — Encounter (HOSPITAL_COMMUNITY): Payer: Self-pay

## 2021-03-14 ENCOUNTER — Ambulatory Visit (HOSPITAL_COMMUNITY)
Admission: EM | Admit: 2021-03-14 | Discharge: 2021-03-14 | Disposition: A | Discharge status: Home / Self Care | Payer: Medicare (Managed Care) | Attending: Internal Medicine | Admitting: Internal Medicine

## 2021-03-14 DIAGNOSIS — S63502A Unspecified sprain of left wrist, initial encounter: Secondary | ICD-10-CM

## 2021-03-14 DIAGNOSIS — W19XXXA Unspecified fall, initial encounter: Secondary | ICD-10-CM

## 2021-03-14 DIAGNOSIS — M25532 Pain in left wrist: Secondary | ICD-10-CM

## 2021-03-14 NOTE — Discharge Instructions (Addendum)
You can take Tylenol and/or ibuprofen as needed for pain relief and fever reduction.   Wear the wrist splint for comfort.   Rest as much as possible Ice for 10-15 minutes every 4-6 hours as needed for pain and swelling Compression- use an ace bandage or splint for comfort Elevate above your hip/heart when sitting and laying down  Follow up with sports medicine or orthopedics if symptoms do not improve in the next week.

## 2021-03-14 NOTE — ED Provider Notes (Signed)
[facility]    CSN: [id number] Arrival date & time: 03/14/21  1950      History   Chief Complaint Chief Complaint  Patient presents with   Fall    HPI Ruth Porter is a 87 y.o. female.   Patient here for evaluation of left wrist pain and swelling following a fall that occurred earlier today.  Patient reports that she tripped on her coffee table and landed on her left wrist.  Reports pain and swelling have gotten progressively worse over the past few hours.  Has not taken any OTC medications or treatments.  Denies any trauma, injury, or other precipitating event.  Denies any specific alleviating or aggravating factors.  Denies any fevers, chest pain, shortness of breath, N/V/D, numbness, tingling, weakness, abdominal pain, or headaches.    The history is provided by the patient.  Fall   Past Medical History:  Diagnosis Date   Anxiety    Cataract    right   Diverticulosis    Hemorrhoids    Hyperlipemia    Hypothyroidism    Reflux     There are no problems to display for this patient.   Past Surgical History:  Procedure Laterality Date   APPENDECTOMY     BILATERAL SALPINGOOPHORECTOMY     TOTAL ABDOMINAL HYSTERECTOMY      OB History   No obstetric history on file.      Home Medications    Prior to Admission medications   Medication Sig Start Date End Date Taking? Authorizing Provider  aspirin EC 81 MG tablet Take 81 mg by mouth daily. Swallow whole.    [provider]  Cholecalciferol (VITAMIN D) 125 MCG (5000 UT) CAPS Take 5,000 Units by mouth daily.    [provider]  escitalopram (LEXAPRO) 10 MG tablet Take 0.5 tablets by mouth daily. 11/08/20   [provider]  levothyroxine (SYNTHROID) 50 MCG tablet Take 50 mcg by mouth daily. 11/08/20   [provider]  metoprolol tartrate (LOPRESSOR) 100 MG tablet Take 1 tablet TWO HOURS BEFORE CT 12/26/20   Pricilla Riffle, MD  Multiple Vitamins-Minerals (CENTRUM ULTRA  WOMENS PO) Take by mouth.    [provider]  Multiple Vitamins-Minerals (PRESERVISION/LUTEIN PO) Take by mouth.    [provider]  rosuvastatin (CRESTOR) 10 MG tablet Take 1 tablet (10 mg total) by mouth daily. 01/18/21   Pricilla Riffle, MD    Family History Family History  Problem Relation Age of Onset   Heart attack Mother    Stroke Mother     Social History Social History   Tobacco Use   Smoking status: Never   Smokeless tobacco: Never  Substance Use Topics   Alcohol use: Not Currently   Drug use: Not Currently     Allergies   Sulfa antibiotics   Review of Systems Review of Systems  Musculoskeletal:  Positive for arthralgias and joint swelling.  All other systems reviewed and are negative.   Physical Exam Triage Vital Signs ED Triage Vitals [03/14/21 2004]  Enc Vitals Group     BP 115/78     Pulse Rate 94     Resp 18     Temp 98.6 F (37 C)     Temp Source Oral     SpO2 94 %     Weight      Height      Head Circumference      Peak Flow      Pain  Score 6     Pain Loc      Pain Edu?      Excl. in GC?    No data found.  Updated Vital Signs BP 115/78 (BP Location: Left Arm)   Pulse 94   Temp 98.6 F (37 C) (Oral)   Resp 18   SpO2 94%   Visual Acuity Right Eye Distance:   Left Eye Distance:   Bilateral Distance:    Right Eye Near:   Left Eye Near:    Bilateral Near:     Physical Exam Vitals and nursing note reviewed.  Constitutional:      General: She is not in acute distress.    Appearance: Normal appearance. She is not ill-appearing, toxic-appearing or diaphoretic.  HENT:     Head: Normocephalic and atraumatic.  Eyes:     Conjunctiva/sclera: Conjunctivae normal.  Cardiovascular:     Rate and Rhythm: Normal rate.     Pulses: Normal pulses.  Pulmonary:     Effort: Pulmonary effort is normal.  Abdominal:     General: Abdomen is flat.  Musculoskeletal:     Right wrist: Swelling, tenderness, bony tenderness and  snuff box tenderness present. No deformity. Decreased range of motion (due to pain). Normal pulse.     Right hand: Normal.     Left hand: Normal.     Cervical back: Normal range of motion.  Skin:    General: Skin is warm and dry.  Neurological:     General: No focal deficit present.     Mental Status: She is alert and oriented to person, place, and time.  Psychiatric:        Mood and Affect: Mood normal.     UC Treatments / Results  Labs (all labs ordered are listed, but only abnormal results are displayed) Labs Reviewed - No data to display  EKG   Radiology DG Wrist Complete Left  Result Date: 03/14/2021 CLINICAL DATA:  Fall, left wrist pain EXAM: LEFT WRIST - COMPLETE 3+ VIEW COMPARISON:  None. FINDINGS: Mildly irregular appearance of the distal radial metaphysis, possibly reflecting a nondisplaced transverse fracture. However, this also could be related to a chronic/prior deformity. Mild degenerative changes of the radiocarpal articulation with chondrocalcinosis at the TFCC. Moderate degenerative changes of the 1st CMC joint. Mild dorsal soft tissue swelling overlying the wrist. IMPRESSION: Mildly irregular appearance of the distal radial metaphysis, possibly reflecting a nondisplaced transverse fracture, although age indeterminate (possibly chronic). Correlate for point tenderness to assess for acute injury in this location. Electronically Signed   By: Charline Bills M.D.   On: 03/14/2021 20:34    Procedures Procedures (including critical care time)  Medications Ordered in UC Medications - No data to display  Initial Impression / Assessment and Plan / UC Course  I have reviewed the triage vital signs and the nursing notes.  Pertinent labs & imaging results that were available during my care of the patient were reviewed by me and considered in my medical decision making (see chart for details).    Assessment negative for any red flags or concerns.  X-ray with a possible  nondisplaced transverse fracture of the distal radial metaphysis of undetermined age.  Thumb spica applied in office.  Tylenol and/or ibuprofen as needed.  Recommend rest, ice, compression, and elevation.  Follow-up with orthopedics if symptoms do not improve in the next few days. Final Clinical Impressions(s) / UC Diagnoses   Final diagnoses:  Wrist sprain, left, initial encounter  Fall,  initial encounter     Discharge Instructions      You can take Tylenol and/or ibuprofen as needed for pain relief and fever reduction.   Wear the wrist splint for comfort.   Rest as much as possible Ice for 10-15 minutes every 4-6 hours as needed for pain and swelling Compression- use an ace bandage or splint for comfort Elevate above your hip/heart when sitting and laying down  Follow up with sports medicine or orthopedics if symptoms do not improve in the next week.      ED Prescriptions   None    PDMP not reviewed this encounter.   Ivette Loyal, NP 03/14/21 2049

## 2021-03-14 NOTE — ED Triage Notes (Signed)
Pt states tripped and fell on her coffee table landing on her lt wrist. C/o lt wrist pain with swelling.

## 2021-03-21 ENCOUNTER — Other Ambulatory Visit: Payer: Medicare (Managed Care)

## 2021-04-05 ENCOUNTER — Telehealth: Payer: Self-pay | Admitting: Internal Medicine

## 2021-04-05 NOTE — Telephone Encounter (Signed)
Pt c/o medication issue:  1. Name of Medication: rosuvastatin (CRESTOR) 10 MG tablet  2. How are you currently taking this medication (dosage and times per day)? 1 tablet daily  3. Are you having a reaction (difficulty breathing--STAT)? no  4. What is your medication issue? Patient states she started the medication and has been having problems with her eyes. She says she was diagnosed with wet macular the first of July in her left eye and started taking shots in the eye. She states now it is in the right eye. She states she thinks it all started when she started the medication and the side effects mentions cloudy vision. She says she is not sure that she can drive and is very sensitive to statins.

## 2021-04-05 NOTE — Telephone Encounter (Signed)
Pt is returning call. Please contact pt on this new  cell phone number [phone]

## 2021-04-05 NOTE — Telephone Encounter (Signed)
Will route to PharmD re: ?side effect of cloudy vision and pt symptoms.

## 2021-04-06 NOTE — Telephone Encounter (Signed)
Rosuvastatin does not affect vision, she should follow up with her optometrist about this since it sounds like she has already been following them for vision issues.

## 2021-04-06 NOTE — Telephone Encounter (Signed)
Pt contacted and has been informed that per PharmD Crestor does affect vision and that she should call her eye doctor.  She said she was dx with macular degeneration in one eye in July and now the other eye and her vision has become poor.  She said she is very sensitive to meds and would like to try taking a short break from Crestor to see if her vision improves.  I adv to hold medication for 10 days and at that time if vision improves to let Dr. Tenny Craw know.  If it does not, go back to taking Crestor 10 mg daily.  She is appreciative and in agreement with this plan.

## 2021-05-31 ENCOUNTER — Encounter: Payer: Self-pay | Admitting: Critical Care Medicine

## 2021-05-31 ENCOUNTER — Ambulatory Visit: Payer: Medicare (Managed Care) | Attending: Critical Care Medicine | Admitting: Critical Care Medicine

## 2021-05-31 ENCOUNTER — Other Ambulatory Visit: Payer: Self-pay

## 2021-05-31 VITALS — BP 137/87 | HR 82 | Resp 16 | Ht 63.0 in | Wt 124.0 lb

## 2021-05-31 DIAGNOSIS — F411 Generalized anxiety disorder: Secondary | ICD-10-CM

## 2021-05-31 DIAGNOSIS — E782 Mixed hyperlipidemia: Secondary | ICD-10-CM | POA: Insufficient documentation

## 2021-05-31 DIAGNOSIS — E039 Hypothyroidism, unspecified: Secondary | ICD-10-CM | POA: Diagnosis not present

## 2021-05-31 DIAGNOSIS — R030 Elevated blood-pressure reading, without diagnosis of hypertension: Secondary | ICD-10-CM | POA: Diagnosis not present

## 2021-05-31 MED ORDER — ESCITALOPRAM OXALATE 10 MG PO TABS: 5.0000 mg | ORAL_TABLET | Freq: Every day | ORAL | 4 refills | Status: DC

## 2021-05-31 MED ORDER — LEVOTHYROXINE SODIUM 50 MCG PO TABS: 50.0000 ug | ORAL_TABLET | Freq: Every day | ORAL | 4 refills | Status: DC

## 2021-05-31 NOTE — Progress Notes (Signed)
New Patient Office Visit  Subjective:  Patient ID: Ruth Porter, female    DOB: 11-24-1932  Age: 87 y.o. MRN: [id number]  CC:  Chief Complaint  Patient presents with   New Patient (Initial Visit)    HPI Ruth Porter presents for transition and est PCP Is a very pleasant 87 year old white female former primary care patient of [facility].  Patient is transitioning here to our clinic for primary care.  She has history of hypothyroidism acquired, hyperlipidemia reflux disease and wet macular edema of the eyes.  The patient is accompanied by her daughter Ruth Porter.  On arrival blood pressure was good at 137/87.  Patient has no real specific complaints at this time.  She lives in an independent living arrangement in a retirement complex.  She is followed by her eye doctor and retinal specialist but she is about to transition to a new retinal doctor.  She has had difficulty with her vision and has been holding her Rova statin because of the side effects of this.  She does take PreserVision vitamin supplementation, she is also on older adult Centrum vitamin supplementation and recently started Prevagen.  The patient has already had her flu vaccinations  We need to get her vaccine records from her former primary care doctor and she signed record release for this.  She has been fully vaccinated for COVID with 2 vaccine she does need a booster vaccine  She knows she received a tetanus vaccine she is not sure when we will get this information from the former primary care doctor.  She needs a new set of screening labs at this visit.  She does go to bed quite late at 1 AM and gets up around 9 AM. The patient does have a history of generalized anxiety disorder she takes low-dose Lexapro for this which has been helpful.  She is on Synthroid 50 mcg daily and vitamin D supplements also occasionally takes a baby aspirin  Patient's been seen by Dorris Carnes of cardiology had coronary  CT scanning earlier this year which did not show significant coronary disease  Dr. Harrington Challenger is aware she is holding her statin therapy    Past Medical History:  Diagnosis Date   Anxiety    Cataract    right   Diverticulosis    Hemorrhoids    Hyperlipemia    Hypothyroidism    Reflux     Past Surgical History:  Procedure Laterality Date   APPENDECTOMY     BILATERAL SALPINGOOPHORECTOMY     TOTAL ABDOMINAL HYSTERECTOMY      Family History  Problem Relation Age of Onset   Heart attack Mother    Stroke Mother     Social History   Socioeconomic History   Marital status: Married    Spouse name: Not on file   Number of children: Not on file   Years of education: Not on file   Highest education level: Not on file  Occupational History   Not on file  Tobacco Use   Smoking status: Never   Smokeless tobacco: Never  Substance and Sexual Activity   Alcohol use: Not Currently   Drug use: Not Currently   Sexual activity: Not on file  Other Topics Concern   Not on file  Social History Narrative   Not on file   Social Determinants of Health   Financial Resource Strain: Not on file  Food Insecurity: Not on file  Transportation Needs: Not on file  Physical  Activity: Not on file  Stress: Not on file  Social Connections: Not on file  Intimate Partner Violence: Not on file    ROS Review of Systems  Constitutional:  Negative for chills, diaphoresis and fever.  HENT:  Negative for congestion, hearing loss, nosebleeds, sore throat and tinnitus.   Eyes:  Positive for visual disturbance. Negative for photophobia and redness.  Respiratory: Negative.  Negative for cough, choking, chest tightness, shortness of breath, wheezing and stridor.   Cardiovascular:  Positive for chest pain. Negative for palpitations and leg swelling.  Gastrointestinal:  Negative for abdominal pain, blood in stool, constipation, diarrhea, nausea and vomiting.  Endocrine: Negative for polydipsia, polyphagia  and polyuria.  Genitourinary:  Negative for decreased urine volume, difficulty urinating, dysuria, flank pain, frequency, hematuria, urgency and vaginal discharge.  Musculoskeletal: Negative.  Negative for back pain, myalgias and neck pain.  Skin: Negative.  Negative for rash.  Allergic/Immunologic: Negative for environmental allergies.  Neurological:  Negative for dizziness, tremors, seizures, weakness and headaches.  Hematological:  Does not bruise/bleed easily.  Psychiatric/Behavioral: Negative.  Negative for suicidal ideas. The patient is not nervous/anxious.    Objective:   Today's Vitals: BP 137/87 (BP Location: Right Arm, Patient Position: Sitting, Cuff Size: Small)   Pulse 82   Resp 16   Ht 5\' 3"  (1.6 m)   Wt 124 lb (56.2 kg)   SpO2 95%   BMI 21.97 kg/m   Physical Exam Vitals reviewed.  Constitutional:      Appearance: Normal appearance. She is well-developed. She is not diaphoretic.  HENT:     Head: Normocephalic and atraumatic.     Nose: No nasal deformity, septal deviation, mucosal edema or rhinorrhea.     Right Sinus: No maxillary sinus tenderness or frontal sinus tenderness.     Left Sinus: No maxillary sinus tenderness or frontal sinus tenderness.     Mouth/Throat:     Pharynx: No oropharyngeal exudate.  Eyes:     General: No scleral icterus.    Conjunctiva/sclera: Conjunctivae normal.     Pupils: Pupils are equal, round, and reactive to light.  Neck:     Thyroid: No thyromegaly.     Vascular: No carotid bruit or JVD.     Trachea: Trachea normal. No tracheal tenderness or tracheal deviation.  Cardiovascular:     Rate and Rhythm: Normal rate and regular rhythm.     Chest Wall: PMI is not displaced.     Pulses: Normal pulses. No decreased pulses.     Heart sounds: Normal heart sounds, S1 normal and S2 normal. Heart sounds not distant. No murmur heard. No systolic murmur is present.  No diastolic murmur is present.    No friction rub. No gallop. No S3 or S4  sounds.  Pulmonary:     Effort: No tachypnea, accessory muscle usage or respiratory distress.     Breath sounds: No stridor. No decreased breath sounds, wheezing, rhonchi or rales.  Chest:     Chest wall: No tenderness.  Abdominal:     General: Bowel sounds are normal. There is no distension.     Palpations: Abdomen is soft. Abdomen is not rigid.     Tenderness: There is no abdominal tenderness. There is no guarding or rebound.  Musculoskeletal:        General: Normal range of motion.     Cervical back: Normal range of motion and neck supple. No edema, erythema or rigidity. No muscular tenderness. Normal range of motion.  Lymphadenopathy:  Head:     Right side of head: No submental or submandibular adenopathy.     Left side of head: No submental or submandibular adenopathy.     Cervical: No cervical adenopathy.  Skin:    General: Skin is warm and dry.     Coloration: Skin is not pale.     Findings: No rash.     Nails: There is no clubbing.  Neurological:     Mental Status: She is alert and oriented to person, place, and time.     Sensory: No sensory deficit.  Psychiatric:        Speech: Speech normal.        Behavior: Behavior normal.    Assessment & Plan:   Problem List Items Addressed This Visit       Endocrine   Hypothyroidism    Hypothyroidism we will continue Synthroid and follow-up thyroid function      Relevant Medications   levothyroxine (SYNTHROID) 50 MCG tablet   Other Relevant Orders   Thyroid Panel With TSH     Other   Mixed hyperlipidemia - Primary    We will hold statins for now and recheck lipid panel      Relevant Orders   Lipid panel   Generalized anxiety disorder    Stable at this time on low-dose Lexapro continue same      Relevant Medications   escitalopram (LEXAPRO) 10 MG tablet   Other Visit Diagnoses     Elevated BP without diagnosis of hypertension       Relevant Orders   Comprehensive metabolic panel   CBC with  Differential/Platelet       Outpatient Encounter Medications as of 05/31/2021  Medication Sig   Cholecalciferol (VITAMIN D) 125 MCG (5000 UT) CAPS Take 5,000 Units by mouth daily.   Multiple Vitamins-Minerals (CENTRUM ULTRA WOMENS PO) Take by mouth.   Multiple Vitamins-Minerals (PRESERVISION/LUTEIN PO) Take by mouth.   [DISCONTINUED] escitalopram (LEXAPRO) 10 MG tablet Take 0.5 tablets by mouth daily.   [DISCONTINUED] levothyroxine (SYNTHROID) 50 MCG tablet Take 50 mcg by mouth daily.   aspirin EC 81 MG tablet Take 81 mg by mouth daily. Swallow whole. (Patient not taking: Reported on 05/31/2021)   escitalopram (LEXAPRO) 10 MG tablet Take 0.5 tablets (5 mg total) by mouth daily.   levothyroxine (SYNTHROID) 50 MCG tablet Take 1 tablet (50 mcg total) by mouth daily.   [DISCONTINUED] metoprolol tartrate (LOPRESSOR) 100 MG tablet Take 1 tablet TWO HOURS BEFORE CT (Patient not taking: Reported on 05/31/2021)   [DISCONTINUED] rosuvastatin (CRESTOR) 10 MG tablet Take 1 tablet (10 mg total) by mouth daily. (Patient not taking: Reported on 05/31/2021)   No facility-administered encounter medications on file as of 05/31/2021.  Screening labs will be obtained and outside records will be obtained as well  Follow-up: Return in about 2 months (around 07/31/2021).   Asencion Noble, MD

## 2021-05-31 NOTE — Assessment & Plan Note (Signed)
Hypothyroidism we will continue Synthroid and follow-up thyroid function

## 2021-05-31 NOTE — Assessment & Plan Note (Signed)
Stable at this time on low-dose Lexapro continue same

## 2021-05-31 NOTE — Assessment & Plan Note (Signed)
We will hold statins for now and recheck lipid panel

## 2021-05-31 NOTE — Patient Instructions (Signed)
Refills on your medication sent to your pharmacy  Please consider getting a COVID booster vaccine  We will get you to sign a record release so we can obtain records from your previous primary care provider  Complete set of screening labs obtained today  Continue your healthy diet as you are currently taking  We discussed sleep hygiene measures including trying to go to bed earlier and listening to soft music before going to bed  Return to Dr. Delford Field in January for complete physical

## 2021-06-01 LAB — COMPREHENSIVE METABOLIC PANEL
ALT: 12 IU/L (ref 0–32)
AST: 23 IU/L (ref 0–40)
Albumin/Globulin Ratio: 1.8 (ref 1.2–2.2)
Albumin: 4.2 g/dL (ref 3.6–4.6)
Alkaline Phosphatase: 98 IU/L (ref 44–121)
BUN/Creatinine Ratio: 19 (ref 12–28)
BUN: 17 mg/dL (ref 8–27)
Bilirubin Total: 0.4 mg/dL (ref 0.0–1.2)
CO2: 25 mmol/L (ref 20–29)
Calcium: 9.9 mg/dL (ref 8.7–10.3)
Chloride: 105 mmol/L (ref 96–106)
Creatinine, Ser: 0.89 mg/dL (ref 0.57–1.00)
Globulin, Total: 2.3 g/dL (ref 1.5–4.5)
Glucose: 91 mg/dL (ref 70–99)
Potassium: 4.2 mmol/L (ref 3.5–5.2)
Sodium: 144 mmol/L (ref 134–144)
Total Protein: 6.5 g/dL (ref 6.0–8.5)
eGFR: 63 mL/min/{1.73_m2} (ref >59)

## 2021-06-01 LAB — CBC WITH DIFFERENTIAL/PLATELET
Basophils Absolute: 0 10*3/uL (ref 0.0–0.2)
Basos: 1 %
EOS (ABSOLUTE): 0.3 10*3/uL (ref 0.0–0.4)
Eos: 5 %
Hematocrit: 39.3 % (ref 34.0–46.6)
Hemoglobin: 12.5 g/dL (ref 11.1–15.9)
Immature Grans (Abs): 0 10*3/uL (ref 0.0–0.1)
Immature Granulocytes: 0 %
Lymphocytes Absolute: 1.3 10*3/uL (ref 0.7–3.1)
Lymphs: 26 %
MCH: 26 pg — ABNORMAL LOW (ref 26.6–33.0)
MCHC: 31.8 g/dL (ref 31.5–35.7)
MCV: 82 fL (ref 79–97)
Monocytes Absolute: 0.5 10*3/uL (ref 0.1–0.9)
Monocytes: 10 %
Neutrophils Absolute: 2.8 10*3/uL (ref 1.4–7.0)
Neutrophils: 58 %
Platelets: 239 10*3/uL (ref 150–450)
RBC: 4.81 x10E6/uL (ref 3.77–5.28)
RDW: 13.9 % (ref 11.7–15.4)
WBC: 4.9 10*3/uL (ref 3.4–10.8)

## 2021-06-01 LAB — LIPID PANEL
Chol/HDL Ratio: 3.7 ratio (ref 0.0–4.4)
Cholesterol, Total: 190 mg/dL (ref 100–199)
HDL: 52 mg/dL (ref >39)
LDL Chol Calc (NIH): 119 mg/dL — ABNORMAL HIGH (ref 0–99)
Triglycerides: 104 mg/dL (ref 0–149)
VLDL Cholesterol Cal: 19 mg/dL (ref 5–40)

## 2021-06-01 LAB — THYROID PANEL WITH TSH
Free Thyroxine Index: 2.2 (ref 1.2–4.9)
T3 Uptake Ratio: 30 % (ref 24–39)
T4, Total: 7.2 ug/dL (ref 4.5–12.0)
TSH: 4.17 u[IU]/mL (ref 0.450–4.500)

## 2021-06-02 ENCOUNTER — Telehealth: Payer: Self-pay

## 2021-06-02 NOTE — Telephone Encounter (Signed)
-----   Message from Storm Frisk, MD sent at 06/01/2021  8:43 AM EST ----- Let pt know thyroid is normal, no change thyroid medication, blood count liver kidney normal, cholesterol not that high, follow a healthy diet and no more statins need to be Rx due to eye disease

## 2021-06-02 NOTE — Telephone Encounter (Signed)
Pt was called and no vm was left due to mailbox being full. Information has been sent to nurse pool and letter.

## 2021-07-31 ENCOUNTER — Ambulatory Visit: Payer: Medicare (Managed Care) | Admitting: Critical Care Medicine

## 2021-08-08 ENCOUNTER — Ambulatory Visit: Payer: Medicare (Managed Care) | Admitting: Critical Care Medicine

## 2021-08-14 NOTE — Progress Notes (Signed)
Established Patient Office Visit  Subjective:  Patient ID: Ruth Porter, female    DOB: [birth year]  Age: 88 y.o. MRN: [id number]  CC: Primary care follow up   HPI Ruth Porter presents for primary care follow-up The patient noticed over the last month she has had 2 falls without injury.  1 when she slipped in the mud the other when she was walking her dog.  She lives in an independent apartment not in assisted living.  She is having trouble organizing her papers and letters and mail.  She denies any memory deficits.  She has not had physical therapy previously.  She is up-to-date on all her vaccines.  We did receive all her records from [place] at the last visit.  She has had all her pneumonia vaccines and flu vaccines.  She declines to receive further tetanus vaccines at this visit.  She is interested in finishing her  shingles shot series.  She had an initial injection in 2012 and has not had a follow-up injection for shingles she has upcoming appointments with cardiology in March.  She also has follow-up with her eye doctor upcoming.  Patient's cholesterol is mildly elevated she is treating this with diet alone.  She is tolerating Lexapro at 5 mg daily without difficulty.  Also maintain Synthroid 50 mcg daily thyroid function was normal at the last visit.    Past Medical History:  Diagnosis Date   Anxiety    Cataract    right   Diverticulosis    Hemorrhoids    Hyperlipemia    Hypothyroidism    Reflux     Past Surgical History:  Procedure Laterality Date   APPENDECTOMY     BILATERAL SALPINGOOPHORECTOMY     TOTAL ABDOMINAL HYSTERECTOMY      Family History  Problem Relation Age of Onset   Heart attack Mother    Stroke Mother     Social History   Socioeconomic History   Marital status: Married    Spouse name: Not on file   Number of children: Not on file   Years of education: Not on file   Highest education level: Not on file  Occupational History    Not on file  Tobacco Use   Smoking status: Never   Smokeless tobacco: Never  Substance and Sexual Activity   Alcohol use: Not Currently   Drug use: Not Currently   Sexual activity: Not on file  Other Topics Concern   Not on file  Social History Narrative   Not on file   Social Determinants of Health   Financial Resource Strain: Not on file  Food Insecurity: Not on file  Transportation Needs: Not on file  Physical Activity: Not on file  Stress: Not on file  Social Connections: Not on file  Intimate Partner Violence: Not on file    Outpatient Medications Prior to Visit  Medication Sig Dispense Refill   aspirin EC 81 MG tablet Take 81 mg by mouth daily. Swallow whole.     chlorhexidine (PERIDEX) 0.12 % solution daily.     Cholecalciferol (VITAMIN D) 125 MCG (5000 UT) CAPS Take 5,000 Units by mouth daily.     Multiple Vitamins-Minerals (CENTRUM ULTRA WOMENS PO) Take by mouth.     Multiple Vitamins-Minerals (PRESERVISION/LUTEIN PO) Take by mouth.     escitalopram (LEXAPRO) 10 MG tablet Take 0.5 tablets (5 mg total) by mouth daily. 45 tablet 4   levothyroxine (SYNTHROID) 50 MCG tablet Take 1 tablet (  50 mcg total) by mouth daily. 60 tablet 4   No facility-administered medications prior to visit.    Allergies  Allergen Reactions   Statins Other (See Comments)    Vision changes   Sulfa Antibiotics Other (See Comments)    Pt allergic to sulfa but does not know reaction    ROS Review of Systems  Constitutional:  Negative for chills, diaphoresis and fever.  HENT:  Negative for congestion, hearing loss, nosebleeds, sore throat and tinnitus.   Eyes:  Negative for photophobia and redness.  Respiratory:  Negative for cough, shortness of breath, wheezing and stridor.   Cardiovascular:  Negative for chest pain, palpitations and leg swelling.  Gastrointestinal:  Negative for abdominal pain, blood in stool, constipation, diarrhea, nausea and vomiting.  Endocrine: Negative for  polydipsia.  Genitourinary:  Negative for dysuria, flank pain, frequency, hematuria and urgency.  Musculoskeletal:  Negative for back pain, myalgias and neck pain.  Skin:  Negative for rash.  Allergic/Immunologic: Negative for environmental allergies.  Neurological:  Negative for dizziness, tremors, seizures, weakness and headaches.       Balance issues with falls  Hematological:  Does not bruise/bleed easily.  Psychiatric/Behavioral:  Negative for suicidal ideas. The patient is not nervous/anxious.      Objective:    Physical Exam Vitals reviewed.  Constitutional:      Appearance: Normal appearance. She is well-developed. She is not diaphoretic.  HENT:     Head: Normocephalic and atraumatic.     Nose: No nasal deformity, septal deviation, mucosal edema or rhinorrhea.     Right Sinus: No maxillary sinus tenderness or frontal sinus tenderness.     Left Sinus: No maxillary sinus tenderness or frontal sinus tenderness.     Mouth/Throat:     Pharynx: No oropharyngeal exudate.  Eyes:     General: No scleral icterus.    Conjunctiva/sclera: Conjunctivae normal.     Pupils: Pupils are equal, round, and reactive to light.  Neck:     Thyroid: No thyromegaly.     Vascular: No carotid bruit or JVD.     Trachea: Trachea normal. No tracheal tenderness or tracheal deviation.  Cardiovascular:     Rate and Rhythm: Normal rate and regular rhythm.     Chest Wall: PMI is not displaced.     Pulses: Normal pulses. No decreased pulses.     Heart sounds: Normal heart sounds, S1 normal and S2 normal. Heart sounds not distant. No murmur heard. No systolic murmur is present.  No diastolic murmur is present.    No friction rub. No gallop. No S3 or S4 sounds.  Pulmonary:     Effort: No tachypnea, accessory muscle usage or respiratory distress.     Breath sounds: No stridor. No decreased breath sounds, wheezing, rhonchi or rales.  Chest:     Chest wall: No tenderness.  Abdominal:     General: Bowel  sounds are normal. There is no distension.     Palpations: Abdomen is soft. Abdomen is not rigid.     Tenderness: There is no abdominal tenderness. There is no guarding or rebound.  Musculoskeletal:        General: Normal range of motion.     Cervical back: Normal range of motion and neck supple. No edema, erythema or rigidity. No muscular tenderness. Normal range of motion.  Lymphadenopathy:     Head:     Right side of head: No submental or submandibular adenopathy.     Left side of head: No  submental or submandibular adenopathy.     Cervical: No cervical adenopathy.  Skin:    General: Skin is warm and dry.     Coloration: Skin is not pale.     Findings: No rash.     Nails: There is no clubbing.  Neurological:     General: No focal deficit present.     Mental Status: She is alert and oriented to person, place, and time. Mental status is at baseline.     Cranial Nerves: No cranial nerve deficit.     Sensory: No sensory deficit.     Motor: No weakness.     Coordination: Coordination normal.     Gait: Gait abnormal.     Deep Tendon Reflexes: Reflexes normal.     Comments: Get up and go's test performed she is a bit wobbly when she first gets up out of a chair and walks  Psychiatric:        Speech: Speech normal.        Behavior: Behavior normal.    BP 121/87    Pulse 76    Resp 16    Wt 124 lb 3.2 oz (56.3 kg)    SpO2 98%    BMI 22.00 kg/m  Wt Readings from Last 3 Encounters:  08/15/21 124 lb 3.2 oz (56.3 kg)  05/31/21 124 lb (56.2 kg)  12/16/20 125 lb 9.6 oz (57 kg)     Health Maintenance Due  Topic Date Due   Zoster Vaccines- Shingrix (1 of 2) Never done    There are no preventive care reminders to display for this patient.  Lab Results  Component Value Date   TSH 4.170 05/31/2021   Lab Results  Component Value Date   WBC 4.9 05/31/2021   HGB 12.5 05/31/2021   HCT 39.3 05/31/2021   MCV 82 05/31/2021   PLT 239 05/31/2021   Lab Results  Component Value Date    NA 144 05/31/2021   K 4.2 05/31/2021   CO2 25 05/31/2021   GLUCOSE 91 05/31/2021   BUN 17 05/31/2021   CREATININE 0.89 05/31/2021   BILITOT 0.4 05/31/2021   ALKPHOS 98 05/31/2021   AST 23 05/31/2021   ALT 12 05/31/2021   PROT 6.5 05/31/2021   ALBUMIN 4.2 05/31/2021   CALCIUM 9.9 05/31/2021   ANIONGAP 8 12/14/2020   EGFR 63 05/31/2021   Lab Results  Component Value Date   CHOL 190 05/31/2021   Lab Results  Component Value Date   HDL 52 05/31/2021   Lab Results  Component Value Date   LDLCALC 119 (H) 05/31/2021   Lab Results  Component Value Date   TRIG 104 05/31/2021   Lab Results  Component Value Date   CHOLHDL 3.7 05/31/2021   No results found for: HGBA1C    Assessment & Plan:   Problem List Items Addressed This Visit       Endocrine   Hypothyroidism    Thyroid function stable continue Synthroid      Relevant Medications   levothyroxine (SYNTHROID) 50 MCG tablet     Other   Mixed hyperlipidemia    Intolerant of statin continue diet control alone with vitamin supplementation      Generalized anxiety disorder    Responding well to low-dose Lexapro continue same 5 mg      Relevant Medications   escitalopram (LEXAPRO) 5 MG tablet   Frequent falls - Primary    Increase recently in falls will refer to physical therapy at  [place] preferred location      Relevant Orders   Ambulatory referral to Physical Therapy   Other Visit Diagnoses     Imbalance       Relevant Orders   Ambulatory referral to Physical Therapy       Meds ordered this encounter  Medications   escitalopram (LEXAPRO) 5 MG tablet    Sig: Take 1 tablet (5 mg total) by mouth daily.    Dispense:  60 tablet    Refill:  2    NEXT REFILL ,  NEW DOSE   levothyroxine (SYNTHROID) 50 MCG tablet    Sig: Take 1 tablet (50 mcg total) by mouth daily.    Dispense:  60 tablet    Refill:  4    Future refill   Zoster Vaccine Adjuvanted [facility]) injection    Sig: Inject 0.5  mLs into the muscle once for 1 dose.    Dispense:  0.5 mL    Refill:  0  Patient is interested in receiving her completion shingles vaccine series therefore a prescription printed and given to her to obtain this at her [place] which will complete her series as the first injection was given in 2012  Follow-up: Return in about 3 months (around 11/12/2021).    Asencion Noble, MD

## 2021-08-15 ENCOUNTER — Ambulatory Visit: Payer: Medicare (Managed Care) | Attending: Critical Care Medicine | Admitting: Critical Care Medicine

## 2021-08-15 ENCOUNTER — Other Ambulatory Visit: Payer: Self-pay

## 2021-08-15 ENCOUNTER — Encounter: Payer: Self-pay | Admitting: Critical Care Medicine

## 2021-08-15 VITALS — BP 121/87 | HR 76 | Resp 16 | Wt 124.2 lb

## 2021-08-15 DIAGNOSIS — Z9181 History of falling: Secondary | ICD-10-CM | POA: Insufficient documentation

## 2021-08-15 DIAGNOSIS — E782 Mixed hyperlipidemia: Secondary | ICD-10-CM

## 2021-08-15 DIAGNOSIS — R2689 Other abnormalities of gait and mobility: Secondary | ICD-10-CM

## 2021-08-15 DIAGNOSIS — R296 Repeated falls: Secondary | ICD-10-CM

## 2021-08-15 DIAGNOSIS — E039 Hypothyroidism, unspecified: Secondary | ICD-10-CM

## 2021-08-15 DIAGNOSIS — F411 Generalized anxiety disorder: Secondary | ICD-10-CM

## 2021-08-15 MED ORDER — LEVOTHYROXINE SODIUM 50 MCG PO TABS: 50.0000 ug | ORAL_TABLET | Freq: Every day | ORAL | 4 refills | Status: DC

## 2021-08-15 MED ORDER — ZOSTER VAC RECOMB ADJUVANTED 50 MCG/0.5ML IM SUSR: 0.5000 mL | Freq: Once | INTRAMUSCULAR | 0 refills | Status: AC

## 2021-08-15 MED ORDER — ESCITALOPRAM OXALATE 5 MG PO TABS: 5.0000 mg | ORAL_TABLET | Freq: Every day | ORAL | 2 refills | Status: DC

## 2021-08-15 NOTE — Assessment & Plan Note (Signed)
Increase recently in falls will refer to physical therapy at [place] her preferred location

## 2021-08-15 NOTE — Assessment & Plan Note (Signed)
Thyroid function stable continue Synthroid 

## 2021-08-15 NOTE — Assessment & Plan Note (Signed)
Responding well to low-dose Lexapro continue same 5 mg

## 2021-08-15 NOTE — Patient Instructions (Addendum)
Referral to physical therapy was made to [facility] [address] for your imbalance and tendency to fall  Refills on medications sent to your pharmacy.  Your lexapro was changed to a single 5mg  tablet  We will show you how to access MyChart  No change in medication dosing  A prescription for a shingles vaccine was given, take to [facility] to obtain vaccine , you may need to call ahead to make an appointment  Return Dr Joya Gaskins 3 months

## 2021-08-15 NOTE — Assessment & Plan Note (Signed)
Intolerant of statin continue diet control alone with vitamin supplementation

## 2021-09-11 NOTE — Therapy (Signed)
OUTPATIENT PHYSICAL THERAPY LOWER EXTREMITY EVALUATION   Patient Name: Ruth Porter MRN: [id number] DOB:Jan 18, 1933, 88 y.o., female Today's Date: 09/12/2021   PT End of Session - 09/12/21 1359     Visit Number 1    Number of Visits 5    Date for PT Re-Evaluation 10/10/21    Authorization Type [employer] MCR advantage    PT Start Time 1312    PT Stop Time 1352    PT Time Calculation (min) 40 min    Activity Tolerance Patient tolerated treatment well    Behavior During Therapy [facility] for tasks assessed/performed             Past Medical History:  Diagnosis Date   Anxiety    Cataract    right   Diverticulosis    Hemorrhoids    Hyperlipemia    Hypothyroidism    Reflux    Past Surgical History:  Procedure Laterality Date   APPENDECTOMY     BILATERAL SALPINGOOPHORECTOMY     TOTAL ABDOMINAL HYSTERECTOMY     Patient Active Problem List   Diagnosis Date Noted   Frequent falls 08/15/2021   Mixed hyperlipidemia 05/31/2021   Hypothyroidism 05/31/2021   Generalized anxiety disorder 05/31/2021    PCP: Storm Frisk, MD  REFERRING PROVIDER: Storm Frisk, MD  REFERRING DIAG: R29.6 (ICD-10-CM) - Frequent falls      R26.89 (ICD-10-CM) - Imbalance   THERAPY DIAG:  Other lack of coordination  Muscle weakness (generalized)  ONSET DATE: MD visit/script  08/15/2021  SUBJECTIVE:   SUBJECTIVE STATEMENT: Pt was 12 mins late to Rx.    Pt denies any falls in the past month and reports 2 falls in the past 6 months.  Pt states she has had a lot of close calls.  Pt reports having balance issues with standing up from a bent over position and also lifting objects.  Pt does have some balance deficits with household chores including using her step ladder to get into her cabinets and change bulbs.    Pt states her posture has worsened causing her to bend forward which makes her balance off.  Pt sees chiropractor which helps her posture.    Pt enjoys water aerobics.     PERTINENT HISTORY: Osteoporosis, Anxiety, macular degeneration  PAIN:  Are you having pain? No at rest NPRS scale: 0/10, 5/10 worst Pain location: L hip Aggravating factors: Pt reports she has pain when she stands up after sitting awhile Relieving factors: feels better with movement  PRECAUTIONS: Fall  WEIGHT BEARING RESTRICTIONS No  FALLS:  Has patient fallen in last 6 months? Yes, Number of falls: 2  LIVING ENVIRONMENT: Lives with: lives alone Lives in: Independent living apartment Stairs: Uses an Engineer, structural Has following equipment at home: Walker - 2 wheeled  PLOF: Independent  PATIENT GOALS improved balance, strengthen back, and increase activity   OBJECTIVE:   DIAGNOSTIC FINDINGS: N/A  PATIENT SURVEYS:  FOTO Will give next visit   COGNITION:  Overall cognitive status: Within functional limits for tasks assessed      POSTURE:  Pt able to stand with good posture though can feel the strain in her back.  Pt demonstrates how she normally stands which was with kyphotic posture.     LE MMT:  MMT Right 09/12/2021 Left 09/12/2021  Hip flexion 5/5 5/5  Hip extension    Hip abduction WFL tested in sitting Weakness though tolerated mod resistance in sitting  Hip adduction    Hip internal rotation  Hip external rotation 4-/5 4+/5  Knee flexion 5/5 5/5  Knee extension 5/5 4/5  Ankle dorsiflexion 5/5 5/5  Ankle plantarflexion [facility] WFL  Ankle inversion    Ankle eversion     (Blank rows = not tested)   FUNCTIONAL TESTS:  TUG: 15 seconds without AD  BALANCE:  -Pt able to stand with FT x 30 sec without LOB though did have increased sway.  -Tandem stance:  R LE back:  17 seconds, L LE back:  7 seconds -SLS:  R:  7 seconds L: 18 seconds,   GAIT: Level of assistance: Complete Independence Comments: Pt ambulates in clinic without an AD without LOB.     TODAY'S TREATMENT: Explained to Pt POC.  Did not give HEP due to pt arriving late.    PATIENT  EDUCATION:  Education details: POC, objective findings. Person educated: Patient Education method: Explanation Education comprehension: verbalized understanding and needs further education   HOME EXERCISE PROGRAM: Will give next visit.   ASSESSMENT:  CLINICAL IMPRESSION: Patient is a 88 y.o. female with a dx of frequent falls and imbalance presenting to the clinic with balance deficits and muscle weakness in bilat LE's.  Pt reports having no falls in the past month, but has had several close calls.  Pt has had 2 falls in the past 6 months.  Pt reports having balance issues with standing up from a bent over position, performing household chores, and also lifting objects.  Pt had slightly increased time on TUG test though was very stable having no LOB.  Pt had increased LOB/difficulty with tandem stance with L LE back and SLS on R.  Pt should benefit from skilled PT to address impairments and to assist in restoring desired level of function.      OBJECTIVE IMPAIRMENTS decreased activity tolerance, decreased balance, decreased mobility, decreased strength, and pain.   ACTIVITY LIMITATIONS cleaning.   PERSONAL FACTORS 2 co-morbidities (macular degeneration and osteoporosis) are also affecting patient's functional outcome.    REHAB POTENTIAL: Good  CLINICAL DECISION MAKING: Stable/uncomplicated  EVALUATION COMPLEXITY: Low   GOALS:   SHORT TERM GOALS:  STG Name Target Date Goal status  1 Pt will demo improved tandem stance to 20 sec bilat for improved balance and stability with daily mobility.  Baseline:  10/03/2021 INITIAL  2 Pt will report improved stability with performing household chores including functional lifting and carrying objects in her home.   Baseline:  10/03/2021 INITIAL                            LONG TERM GOALS:   LTG Name Target Date Goal status  1 Pt will be independent and compliant with HEP for improved balance, mobility, and function.  Baseline:  10/10/2021 INITIAL  2 Pt will report at least a 65-70% improvement in her balance and stability overall.   Baseline: 10/10/2021 INITIAL  3 Pt will demonstrate at least a 3-5 second improvement on TUG score for improved functional mobility and reduced risk of falling.  Baseline: 10/10/2021 INITIAL                       PLAN: PT FREQUENCY: 1x/week  PT DURATION: 4 weeks  PLANNED INTERVENTIONS: Therapeutic exercises, Therapeutic activity, Neuromuscular re-education, Balance training, Gait training, Patient/Family education, Stair training, Aquatic Therapy, Cryotherapy, Moist heat, Manual therapy, and Neuro Muscular re-education  PLAN FOR NEXT SESSION: Berg Balance test next visit.  Establish HEP.  Audie Clear III PT, DPT 09/12/21 11:19 PM

## 2021-09-12 ENCOUNTER — Encounter (HOSPITAL_BASED_OUTPATIENT_CLINIC_OR_DEPARTMENT_OTHER): Payer: Self-pay | Admitting: Physical Therapy

## 2021-09-12 ENCOUNTER — Other Ambulatory Visit: Payer: Self-pay

## 2021-09-12 ENCOUNTER — Ambulatory Visit (HOSPITAL_BASED_OUTPATIENT_CLINIC_OR_DEPARTMENT_OTHER): Payer: Medicare (Managed Care) | Attending: Critical Care Medicine | Admitting: Physical Therapy

## 2021-09-12 DIAGNOSIS — M6281 Muscle weakness (generalized): Secondary | ICD-10-CM | POA: Diagnosis not present

## 2021-09-12 DIAGNOSIS — R2689 Other abnormalities of gait and mobility: Secondary | ICD-10-CM | POA: Insufficient documentation

## 2021-09-12 DIAGNOSIS — R278 Other lack of coordination: Secondary | ICD-10-CM | POA: Diagnosis not present

## 2021-09-12 DIAGNOSIS — R296 Repeated falls: Secondary | ICD-10-CM | POA: Insufficient documentation

## 2021-09-18 ENCOUNTER — Telehealth: Payer: Self-pay | Admitting: Critical Care Medicine

## 2021-09-18 NOTE — Telephone Encounter (Signed)
She called me about this request   there is an existing order   the MD usually does not do prior authorization  can you partner with [employer] for this ? ?

## 2021-09-18 NOTE — Telephone Encounter (Signed)
Fyi.

## 2021-09-18 NOTE — Telephone Encounter (Signed)
Copied from CRM [id number]. Topic: General - Other ?>> Sep 18, 2021  1:42 PM Jaquita Rector A wrote: ?Reason for CRM: Patient called in to inform dr Delford Field nurse that she need an expedited request authorization for PT that he reques-ted that she take at [facility]. Say that there are some static between office and [employer]. Say that the visit need to be authorized by Dr Delford Field for [facility] say please call Ph#  [phone] to call for expidited authorization request and reach patient at Ph# [phone] ?- ?

## 2021-09-19 NOTE — Telephone Encounter (Signed)
Can you help me with this?

## 2021-09-20 NOTE — Telephone Encounter (Signed)
Note:

## 2021-09-20 NOTE — Telephone Encounter (Signed)
I called patient and she explained that Dr Delford Field needs to provide authorization to [facility] for her outpatient therapy services or they will not be covered. I explained to her that is not typically what happens with an outpatient therapy referral. The therapy facility will obtain any needed authorizations.  She stated that she has spoken to [place]  and was informed authorizations are needed - authorization # [id number] and customer service # [phone].  I told her I would check into this and call her back. ? ?I called outpatient therapy at [facility], spoke to [place] who stated that with  [facility], no authorization is needed and she will call the patient to explain.  ?

## 2021-09-21 ENCOUNTER — Telehealth: Payer: Self-pay

## 2021-09-21 NOTE — Telephone Encounter (Signed)
Call placed to patient to confirm that she was contacted by the rehab facility and informed that authorizations are not needed for her therapy services. Message left with call back requested  ?

## 2021-09-22 ENCOUNTER — Ambulatory Visit (HOSPITAL_BASED_OUTPATIENT_CLINIC_OR_DEPARTMENT_OTHER): Payer: Medicare (Managed Care) | Attending: Critical Care Medicine | Admitting: Physical Therapy

## 2021-09-22 DIAGNOSIS — M6281 Muscle weakness (generalized): Secondary | ICD-10-CM | POA: Insufficient documentation

## 2021-09-22 DIAGNOSIS — R278 Other lack of coordination: Secondary | ICD-10-CM | POA: Insufficient documentation

## 2021-09-24 NOTE — Progress Notes (Unsigned)
Cardiology Office Note   Date:  09/25/2021   ID:  Ruth Porter 05-May-1933, MRN [id number]  PCP:  Storm Frisk, MD  Cardiologist:   Dietrich Pates, MD   Pt referred as f/u for weakness   History of Present Illness: Ruth Porter is a 88 y.o. female with hx of  Hypothyroidism.   No known CAD    The pt presents today on f/u from ER visit on 12/14/20  The pt says she was doing OK  Until early May 2022  Says she developed a viral type infection. Was recovering.   On 5/21 she was seen in Urgent care.  That morning she became weak in legs, no strength   Resolved  ALso complained of some chest pressure and R jaw pain  Being treated for dental problem, on amoxicillin. Pt did not go to ED for further eval On 6/1 she went to ED for complaints of fatigue. BP 163/   Noted mild dizziness  Lab work normal (trop, UA).  EKG unremarkable   Pt sent out  Talking to the pt she says she has been very active but says she has gradually slowed.   More acute now. SHe also notes intermitt chest discomfort for years.    Had stress test years ago (medicine given)   Says the test was horrible.    Pt says both mother and sister had signif CAD     I saw the pt in Summer 2022  Since seen the pt has      Pt denis CP    She says she overdid things in Dec    Zonked in January  February   bettter   Still draging      Current Meds  Medication Sig   Apoaequorin (PREVAGEN PO) Take by mouth.   aspirin EC 81 MG tablet Take 81 mg by mouth daily. Swallow whole.   chlorhexidine (PERIDEX) 0.12 % solution daily.   Cholecalciferol (VITAMIN D) 125 MCG (5000 UT) CAPS Take 5,000 Units by mouth daily.   escitalopram (LEXAPRO) 5 MG tablet Take 1 tablet (5 mg total) by mouth daily.   levothyroxine (SYNTHROID) 50 MCG tablet Take 1 tablet (50 mcg total) by mouth daily.   Multiple Vitamins-Minerals (CENTRUM ULTRA WOMENS PO) Take by mouth.   Multiple Vitamins-Minerals (PRESERVISION/LUTEIN PO) Take by mouth.      Allergies:   Statins and Sulfa antibiotics   Past Medical History:  Diagnosis Date   Anxiety    Cataract    right   Diverticulosis    Hemorrhoids    Hyperlipemia    Hypothyroidism    Reflux     Past Surgical History:  Procedure Laterality Date   APPENDECTOMY     BILATERAL SALPINGOOPHORECTOMY     TOTAL ABDOMINAL HYSTERECTOMY       Social History:  The patient  reports that she has never smoked. She has never used smokeless tobacco. She reports that she does not currently use alcohol. She reports that she does not currently use drugs.   Family History:  The patient's family history includes Heart attack in her mother; Stroke in her mother.    ROS:  Please see the history of present illness. All other systems are reviewed and  Negative to the above problem except as noted.    PHYSICAL EXAM: VS:  BP 118/80    Pulse 65    Ht 5\' 3"  (1.6 m)    Wt 118 lb (53.5 kg)  BMI 20.90 kg/m    Orthostatics:  BP 106/88 P 72 (laying);  110/72  P 76 (sitting)   120/70 P 80  (standing)   118/76 P 80 (standing 4 min)  GEN: Well nourished, well developed, in no acute distress  HEENT: normal  Neck: no JVD, carotid bruits, or masses Cardiac: RRR; no murmurs, rubs, or gallops,no edema  Respiratory:  clear to auscultation bilaterally, normal work of breathing GI: soft, nontender, nondistended, + BS  No hepatomegaly  MS: no deformity Moving all extremities   Skin: warm and dry, no rash Neuro:  Strength and sensation are intact Psych: euthymic mood, full affect   EKG:  EKG is not ordered today.   Lipid Panel    Component Value Date/Time   CHOL 190 05/31/2021 1005   TRIG 104 05/31/2021 1005   HDL 52 05/31/2021 1005   CHOLHDL 3.7 05/31/2021 1005   LDLCALC 119 (H) 05/31/2021 1005      Wt Readings from Last 3 Encounters:  09/25/21 118 lb (53.5 kg)  08/15/21 124 lb 3.2 oz (56.3 kg)  05/31/21 124 lb (56.2 kg)      ASSESSMENT AND PLAN:  Pt is an 88 yo with hx of weakness,  fatigue    Also intermitt chest discomfort   SYmptoms are not completely typical (chest discomfort not tied to activity)   But, not able to explan otherwise in a 88 yo woman who otherwise is without signif medical problems, used to being activity.   COncern for CAD given Hx of both mother and sister with significant CAD  Reocmm:   WIll check TSH to r/o inadequant replacement therapy. With profound reaction to previous myoview in 2011, will arrange for CT coronary angiogram to eval coronary anatomy To this point take activity as tolerated  2  Thyroid  Check TSH  3  Lipids   LDL  119  HDL 50  Trig 89   Follow for now      Current medicines are reviewed at length with the patient today.  The patient does not have concerns regarding medicines.  Signed, Dietrich Pates, MD  09/25/2021 2:56 PM    [facility] [address] [place], [place], [place]  [address] Phone: [phone]; Fax: [phone]

## 2021-09-25 ENCOUNTER — Encounter: Payer: Self-pay | Admitting: Internal Medicine

## 2021-09-25 ENCOUNTER — Other Ambulatory Visit: Payer: Self-pay

## 2021-09-25 ENCOUNTER — Ambulatory Visit (INDEPENDENT_AMBULATORY_CARE_PROVIDER_SITE_OTHER): Payer: Medicare (Managed Care) | Admitting: Internal Medicine

## 2021-09-25 VITALS — BP 118/80 | HR 65 | Ht 63.0 in | Wt 118.0 lb

## 2021-09-25 DIAGNOSIS — E785 Hyperlipidemia, unspecified: Secondary | ICD-10-CM | POA: Diagnosis not present

## 2021-09-25 DIAGNOSIS — Z79899 Other long term (current) drug therapy: Secondary | ICD-10-CM | POA: Diagnosis not present

## 2021-09-25 MED ORDER — SIMVASTATIN 20 MG PO TABS: 20.0000 mg | ORAL_TABLET | Freq: Every day | ORAL | 3 refills | Status: DC

## 2021-09-25 NOTE — Patient Instructions (Signed)
Medication Instructions:  ?Start Simvastatin 20 mg daily ?*If you need a refill on your cardiac medications before your next appointment, please call your pharmacy* ? ? ?Lab Work: ?Lipid and hepatic in 8 weeks  ?If you have labs (blood work) drawn today and your tests are completely normal, you will receive your results only by: ?MyChart Message (if you have MyChart) OR ?A paper copy in the mail ?If you have any lab test that is abnormal or we need to change your treatment, we will call you to review the results. ? ? ?Testing/Procedures: ?none ? ? ?Follow-Up: ?At [facility], you and your health needs are our priority.  As part of our continuing mission to provide you with exceptional heart care, we have created designated Provider Care Teams.  These Care Teams include your primary Cardiologist (physician) and Advanced Practice Providers (APPs -  Physician Assistants and Nurse Practitioners) who all work together to provide you with the care you need, when you need it. ? ?We recommend signing up for the patient portal called "MyChart".  Sign up information is provided on this After Visit Summary.  MyChart is used to connect with patients for Virtual Visits (Telemedicine).  Patients are able to view lab/test results, encounter notes, upcoming appointments, etc.  Non-urgent messages can be sent to your provider as well.   ?To learn more about what you can do with MyChart, go to [email or web address].   ? ?Your next appointment:   ?1 year(s) ? ?The format for your next appointment:   ?In Person ?Dr. Dietrich Pates  ?

## 2021-09-27 ENCOUNTER — Other Ambulatory Visit: Payer: Self-pay

## 2021-09-27 ENCOUNTER — Encounter (HOSPITAL_BASED_OUTPATIENT_CLINIC_OR_DEPARTMENT_OTHER): Payer: Self-pay | Admitting: Physical Therapy

## 2021-09-27 ENCOUNTER — Ambulatory Visit (HOSPITAL_BASED_OUTPATIENT_CLINIC_OR_DEPARTMENT_OTHER): Payer: Medicare (Managed Care) | Admitting: Physical Therapy

## 2021-09-27 DIAGNOSIS — M6281 Muscle weakness (generalized): Secondary | ICD-10-CM | POA: Diagnosis present

## 2021-09-27 DIAGNOSIS — R278 Other lack of coordination: Secondary | ICD-10-CM | POA: Diagnosis present

## 2021-09-27 NOTE — Therapy (Signed)
?OUTPATIENT PHYSICAL THERAPY TREATMENT NOTE ? ? ?Patient Name: Ruth Porter ?MRN: [id number] ?DOB:1932/09/09, 88 y.o., female ?Today's Date: 09/28/2021 ? ?PCP: Storm FriskWright, Patrick E, MD ?REFERRING PROVIDER: Storm FriskWright, Patrick E, MD ? ? PT End of Session - 09/27/21 1435   ? ? Visit Number 2   ? Number of Visits 5   ? Date for PT Re-Evaluation 10/10/21   ? Authorization Type [employer] MCR advantage   ? PT Start Time 1352   ? PT Stop Time 1434   ? PT Time Calculation (min) 42 min   ? Activity Tolerance Patient tolerated treatment well   ? [facility] tasks assessed/performed   ? ?  ?  ? ?  ? ? ?Past Medical History:  ?Diagnosis Date  ? Anxiety   ? Cataract   ? right  ? Diverticulosis   ? Hemorrhoids   ? Hyperlipemia   ? Hypothyroidism   ? Reflux   ? ?Past Surgical History:  ?Procedure Laterality Date  ? APPENDECTOMY    ? BILATERAL SALPINGOOPHORECTOMY    ? TOTAL ABDOMINAL HYSTERECTOMY    ? ?Patient Active Problem List  ? Diagnosis Date Noted  ? Frequent falls 08/15/2021  ? Mixed hyperlipidemia 05/31/2021  ? Hypothyroidism 05/31/2021  ? Generalized anxiety disorder 05/31/2021  ? ? ?  ?REFERRING PROVIDER: Storm FriskWright, Patrick E, MD ?  ?REFERRING DIAG: R29.6 (ICD-10-CM) - Frequent falls ?     R26.89 (ICD-10-CM) - Imbalance  ?  ?THERAPY DIAG:  ?Other lack of coordination ?  ?Muscle weakness (generalized) ?  ?ONSET DATE: MD visit/script  08/15/2021 ?  ?SUBJECTIVE:  ?  ?SUBJECTIVE STATEMENT: ? ?Pt reports having balance issues with standing up from a bent over position and also lifting objects.  Pt does have some balance deficits with household chores including using her step ladder to get into her cabinets and change bulbs.   ? ?Pt states she fell 2 days ago when she was trying to walk thru the doorway past her her dog.  She fell onto her L side.  She reports having soreness though no significant pain.  Pt has had no other falls recently.  Pt states she is working with her chiropractor to strengthen her upper back to  stand up straight.  Pt states her posture has worsened causing her to bend forward which makes her balance off. ?  ?PERTINENT HISTORY: ?Osteoporosis, Anxiety, macular degeneration ?  ?PAIN:  ?Are you having pain? No at rest ?NPRS scale: 0/10, 5/10 worst ?Pain location: L hip ?Aggravating factors: Pt reports she has pain when she stands up after sitting awhile ?Relieving factors: feels better with movement ?  ?PRECAUTIONS: Fall ?  ?WEIGHT BEARING RESTRICTIONS No ?  ?FALLS:  ?Has patient fallen in last 6 months? Yes, Number of falls: 2 ?  ?LIVING ENVIRONMENT: ?Lives with: lives alone ?Lives in: Independent living apartment ?Stairs: Uses an elevator ?Has following equipment at home: Dan HumphreysWalker - 2 wheeled ?  ?PLOF: Independent ?  ?PATIENT GOALS improved balance, strengthen back, and increase activity ?  ?  ?OBJECTIVE:  ?  ?DIAGNOSTIC FINDINGS: N/A ?  ?PATIENT SURVEYS:  ?FOTO Will give next visit  ?  ?  ? TODAY'S TREATMENT: ? ?Neuro Re-ed Activities: ?          Sharlene Motts-Berg Balance Test:  54/56 ?  Scoring:  1-7=4, 8=3, 9-13=4, 14=3. ? -Tandem stance 2x20 sec  ? -Rows with retraction with RTB 1x15 and GTB 1x10 and 1x15 for improved postural strength and stability. ? ?  Therapeutic Exercise: ? Pt performed: ?  Heel raises 2x10 reps ?Toe raises 2x10 reps  ?Standing hip abduction 2x10 reps ? ?Gave pt a HEP handout.  ?  ?  ?  ?  ?  ?PATIENT EDUCATION:  ?Education details: pt received a HEP handout and was educated in correct form and appropriate frequency.  Pt instructed in using counter for support with exercises for safety and support.  ?Person educated: Patient ?Education method: Explanation, demonstration, verbal and tactile cues, handout ?Education comprehension: verbalized understanding, returned demonstration, verbal and tactile cues required, and needs further education ?  ?  ?HOME EXERCISE PROGRAM: ?Access Code: [email or web address] ?URL: [email or web address] ?Date: 09/27/2021 ?Prepared by: Aaron Edelman ? ?Exercises ?Standing Shoulder Row with Anchored Resistance - 1 x daily - 4-5 x weekly - 2 sets - 10 reps ?Heel Raises with Counter Support - 1 x daily - 5-6 x weekly - 2 sets - 10 reps ?Toe Raises with Counter Support - 1 x daily - 5-6 x weekly - 2 sets - 10 reps ?Standing Tandem Balance with Counter Support - 1 x daily - 5-6 x weekly - 2-3 reps - 20 seconds hold ?Standing Hip Abduction with Counter Support - 1 x daily - 4-5 x weekly - 2 sets - 10 reps ? ?  ?ASSESSMENT: ?  ?CLINICAL IMPRESSION: ?Patient performed the [facility] Test today and did very well scoring 54/56.  She demonstrates improved performance on tandem stance and SLS today compared to initial evaluation.  PT gave pt a HEP handout.  Pt performed exercises well with cuing and instruction for correct form.  She demonstrate good understanding of HEP.  Pt responded well to Rx having no c/o's after Rx though was fatigued.  Pt should benefit from skilled PT to address impairments and goals and to assist in restoring desired level of function.    ?  ?  ?OBJECTIVE IMPAIRMENTS decreased activity tolerance, decreased balance, decreased mobility, decreased strength, and pain.  ?  ?ACTIVITY LIMITATIONS cleaning.  ?  ?PERSONAL FACTORS 2 co-morbidities (macular degeneration and osteoporosis) are also affecting patient's functional outcome.  ?  ?  ?REHAB POTENTIAL: Good ?  ?CLINICAL DECISION MAKING: Stable/uncomplicated ?  ?EVALUATION COMPLEXITY: Low ?  ?  ?GOALS: ?  ?  ?SHORT TERM GOALS: ?  ?STG Name Target Date Goal status  ?1 Pt will demo improved tandem stance to 20 sec bilat for improved balance and stability with daily mobility.  ?Baseline:  10/03/2021 INITIAL  ?2 Pt will report improved stability with performing household chores including functional lifting and carrying objects in her home.   ?Baseline:  10/03/2021 INITIAL  ?         ?         ?         ?         ?         ?  ?LONG TERM GOALS:  ?  ?LTG Name Target Date Goal status  ?1 Pt will be  independent and compliant with HEP for improved balance, mobility, and function.  ?Baseline: 10/10/2021 INITIAL  ?2 Pt will report at least a 65-70% improvement in her balance and stability overall.   ?Baseline: 10/10/2021 INITIAL  ?3 Pt will demonstrate at least a 3-5 second improvement on TUG score for improved functional mobility and reduced risk of falling.  ?Baseline: 10/10/2021 INITIAL  ?         ?         ?         ?         ?  ?  PLAN: ?PT FREQUENCY: 1x/week ?  ?PT DURATION: 4 weeks ?  ?PLANNED INTERVENTIONS: Therapeutic exercises, Therapeutic activity, Neuromuscular re-education, Balance training, Gait training, Patient/Family education, Stair training, Aquatic Therapy, Cryotherapy, Moist heat, Manual therapy, and Neuro Muscular re-education ?  ?PLAN FOR NEXT SESSION: Review and perform HEP. Cont with strengthening and balance activities.   ?  ?  ? ? ?Audie Clear III PT, DPT ?09/28/21 5:42 PM ? ? ?  ? ?

## 2021-10-02 NOTE — Addendum Note (Signed)
Addended by: Janan Halter F on: 10/02/2021 04:39 PM ? ? Modules accepted: Orders ? ?

## 2021-10-03 ENCOUNTER — Other Ambulatory Visit: Payer: Self-pay

## 2021-10-03 ENCOUNTER — Ambulatory Visit (HOSPITAL_BASED_OUTPATIENT_CLINIC_OR_DEPARTMENT_OTHER): Payer: Medicare (Managed Care) | Admitting: Physical Therapy

## 2021-10-03 DIAGNOSIS — R278 Other lack of coordination: Secondary | ICD-10-CM | POA: Diagnosis not present

## 2021-10-03 DIAGNOSIS — M6281 Muscle weakness (generalized): Secondary | ICD-10-CM

## 2021-10-03 NOTE — Therapy (Signed)
?OUTPATIENT PHYSICAL THERAPY TREATMENT NOTE ? ? ?Patient Name: Ruth Porter ?MRN: [id number] ?DOB:11-02-1932, 88 y.o., female ?Today's Date: 10/04/2021 ? ?PCP: Elsie Stain, MD ?REFERRING PROVIDER: Elsie Stain, MD ? ? PT End of Session - 10/03/21 1202   ? ? Visit Number 3   ? Number of Visits 5   ? Date for PT Re-Evaluation 10/10/21   ? Authorization Type [employer] M[employer] advantage   ? PT Start Time 1156   ? PT Stop Time 1237   ? PT Time Calculation (min) 41 min   ? Activity Tolerance Patient tolerated treatment well   ? Behavior During Therapy [facility] for tasks assessed/performed   ? ?  ?  ? ?  ? ? ?Past Medical History:  ?Diagnosis Date  ? Anxiety   ? Cataract   ? right  ? Diverticulosis   ? Hemorrhoids   ? Hyperlipemia   ? Hypothyroidism   ? Reflux   ? ?Past Surgical History:  ?Procedure Laterality Date  ? APPENDECTOMY    ? BILATERAL SALPINGOOPHORECTOMY    ? TOTAL ABDOMINAL HYSTERECTOMY    ? ?Patient Active Problem List  ? Diagnosis Date Noted  ? Frequent falls 08/15/2021  ? Mixed hyperlipidemia 05/31/2021  ? Hypothyroidism 05/31/2021  ? Generalized anxiety disorder 05/31/2021  ? ? ?  ?REFERRING PROVIDER: Elsie Stain, MD ?  ?REFERRING DIAG: R29.6 (ICD-10-CM) - Frequent falls ?     R26.89 (ICD-10-CM) - Imbalance  ?  ?THERAPY DIAG:  ?Other lack of coordination ?  ?Muscle weakness (generalized) ?  ?ONSET DATE: MD visit/script  08/15/2021 ?  ?SUBJECTIVE:  ?  ?SUBJECTIVE STATEMENT: ?Pt reports she felt good after prior rx.  Pt states she was very excited about her exercises and overdid it with her exercises at home.  She was very sore with her home exercises.  Pt reports improved balance.  Pt states her L sided ribs are sore from her fall on the Sunday before last.  Pt has stopped the rows with T band due to increased pain in L sided ribs.  Pt hasn't seen MD and states she is improving.  Pt denies any other falls.   ?Pt states she just started statins last night and her legs feel weak.   ?  ?PERTINENT  HISTORY: ?Osteoporosis, Anxiety, macular degeneration ?  ?PAIN:  ?Are you having pain? No at rest ?NPRS scale: 0/10, 5/10 worst ?Pain location: L hip ?Aggravating factors: Pt reports she has pain when she stands up after sitting awhile ?Relieving factors: feels better with movement ?  ?PRECAUTIONS: Fall ?  ?WEIGHT BEARING RESTRICTIONS No ?  ?FALLS:  ?Has patient fallen in last 6 months? Yes, Number of falls: 2 ?  ?LIVING ENVIRONMENT: ?Lives with: lives alone ?Lives in: Independent living apartment ?Stairs: Uses an elevator ?Has following equipment at home: Gilford Rile - 2 wheeled ?  ?PLOF: Independent ?  ?PATIENT GOALS improved balance, strengthen back, and increase activity ?  ?  ?OBJECTIVE:  ?  ?DIAGNOSTIC FINDINGS: N/A ?  ?PATIENT SURVEYS:  ?ABC scale:  79% ?  ?  ? TODAY'S TREATMENT: ? ?Neuro Re-ed Activities: ?          Reviewed response to prior Rx, current function, and HEP compliance. ?Pt performed: ?Tandem stance 2x30 sec without UE assistance except occasional UE assist when R LE was the back leg. ?Tandem Gait at rail with 1-2 occasions of UE assistance with SBA/CGA ?Sidestepping at rail without UE assistance with SBA ?Standing on Airex with FT  2x30 sec and with FA with EC 2x20 sec with CGA ?  Marching on airex 2x10 reps ?  SLS 2x20 sec  ?  Step ups on airex fwd and lateral x 10 reps each without UE assist ?  ? ? ? Therapeutic Exercise: ? Pt performed: ?  Heel raises 2x10 reps ?Toe raises 2x10 reps  ?Standing hip abduction 2x10 reps ? ?  ?   ?PATIENT EDUCATION:  ?Education details: PT answered Pt's questions.  POC, HEP, and exercise form.  Instructed pt to not perform rows with retraction (which she is not performing) due to her rib/side pain.  ?Person educated: Patient ?Education method: Explanation, demonstration, verbal and tactile cues, handout ?Education comprehension: verbalized understanding, returned demonstration, verbal and tactile cues required, and needs further education ?  ?  ?HOME EXERCISE  PROGRAM: ?Access Code: [email or web address] ?URL: [email or web address] ?Date: 09/27/2021 ?Prepared by: Ronny Flurry ? ?Exercises ?Standing Shoulder Row with Anchored Resistance - 1 x daily - 4-5 x weekly - 2 sets - 10 reps ?Heel Raises with Counter Support - 1 x daily - 5-6 x weekly - 2 sets - 10 reps ?Toe Raises with Counter Support - 1 x daily - 5-6 x weekly - 2 sets - 10 reps ?Standing Tandem Balance with Counter Support - 1 x daily - 5-6 x weekly - 2-3 reps - 20 seconds hold ?Standing Hip Abduction with Counter Support - 1 x daily - 4-5 x weekly - 2 sets - 10 reps ? ?  ?ASSESSMENT: ?  ?CLINICAL IMPRESSION: ?Reviewed HEP.  Progressed balance exercises and pt did well.  Pt has improved with SLS.  Pt performed exercises well with cuing and instruction for correct form.  Pt responded well to Rx having no c/o's after Rx.  Pt should benefit from cont skilled PT to address impairments and goals and to assist in restoring desired level of function.    ?  ?  ?OBJECTIVE IMPAIRMENTS decreased activity tolerance, decreased balance, decreased mobility, decreased strength, and pain.  ?  ?ACTIVITY LIMITATIONS cleaning.  ?  ?PERSONAL FACTORS 2 co-morbidities (macular degeneration and osteoporosis) are also affecting patient's functional outcome.  ?  ?  ?REHAB POTENTIAL: Good ?  ?CLINICAL DECISION MAKING: Stable/uncomplicated ?  ?EVALUATION COMPLEXITY: Low ?  ?  ?GOALS: ?  ?  ?SHORT TERM GOALS: ?  ?STG Name Target Date Goal status  ?1 Pt will demo improved tandem stance to 20 sec bilat for improved balance and stability with daily mobility.  ?Baseline:  10/03/2021 INITIAL  ?2 Pt will report improved stability with performing household chores including functional lifting and carrying objects in her home.   ?Baseline:  10/03/2021 INITIAL  ?         ?         ?         ?         ?         ?  ?LONG TERM GOALS:  ?  ?LTG Name Target Date Goal status  ?1 Pt will be independent and compliant with HEP for improved balance, mobility,  and function.  ?Baseline: 10/10/2021 INITIAL  ?2 Pt will report at least a 65-70% improvement in her balance and stability overall.   ?Baseline: 10/10/2021 INITIAL  ?3 Pt will demonstrate at least a 3-5 second improvement on TUG score for improved functional mobility and reduced risk of falling.  ?Baseline: 10/10/2021 INITIAL  ?         ?         ?         ?         ?  ?  PLAN: ?PT FREQUENCY: 1x/week ?  ?PT DURATION: 4 weeks ?  ?PLANNED INTERVENTIONS: Therapeutic exercises, Therapeutic activity, Neuromuscular re-education, Balance training, Gait training, Patient/Family education, Stair training, Aquatic Therapy, Cryotherapy, Moist heat, Manual therapy, and Neuro Muscular re-education ?  ?PLAN FOR NEXT SESSION: Cont with strengthening and balance activities.   ?  ?  ? ? ?Selinda Michaels III PT, DPT ?10/04/21 9:33 AM ? ? ? ?  ? ?

## 2021-10-04 ENCOUNTER — Encounter (HOSPITAL_BASED_OUTPATIENT_CLINIC_OR_DEPARTMENT_OTHER): Payer: Self-pay | Admitting: Physical Therapy

## 2021-10-12 ENCOUNTER — Encounter (HOSPITAL_BASED_OUTPATIENT_CLINIC_OR_DEPARTMENT_OTHER): Payer: Self-pay | Admitting: Physical Therapy

## 2021-10-12 ENCOUNTER — Ambulatory Visit (HOSPITAL_BASED_OUTPATIENT_CLINIC_OR_DEPARTMENT_OTHER): Payer: Medicare (Managed Care) | Admitting: Physical Therapy

## 2021-10-12 DIAGNOSIS — M6281 Muscle weakness (generalized): Secondary | ICD-10-CM

## 2021-10-12 DIAGNOSIS — R278 Other lack of coordination: Secondary | ICD-10-CM | POA: Diagnosis not present

## 2021-10-12 NOTE — Therapy (Signed)
?OUTPATIENT PHYSICAL THERAPY TREATMENT NOTE / RECERT / DISCHARGE ? ? ?Patient Name: Ruth Porter ?MRN: [id number] ?DOB:09/15/1932, 88 y.o., female ?Today's Date: 10/12/2021 ? ?PCP: Elsie Stain, MD ?REFERRING PROVIDER: Elsie Stain, MD ? ? ? ? ?Past Medical History:  ?Diagnosis Date  ? Anxiety   ? Cataract   ? right  ? Diverticulosis   ? Hemorrhoids   ? Hyperlipemia   ? Hypothyroidism   ? Reflux   ? ?Past Surgical History:  ?Procedure Laterality Date  ? APPENDECTOMY    ? BILATERAL SALPINGOOPHORECTOMY    ? TOTAL ABDOMINAL HYSTERECTOMY    ? ?Patient Active Problem List  ? Diagnosis Date Noted  ? Frequent falls 08/15/2021  ? Mixed hyperlipidemia 05/31/2021  ? Hypothyroidism 05/31/2021  ? Generalized anxiety disorder 05/31/2021  ? ? ?  ?REFERRING PROVIDER: Elsie Stain, MD ?  ?REFERRING DIAG: R29.6 (ICD-10-CM) - Frequent falls ?     R26.89 (ICD-10-CM) - Imbalance  ?  ?THERAPY DIAG:  ?Other lack of coordination ?  ?Muscle weakness (generalized) ?  ?ONSET DATE: MD visit/script  08/15/2021 ?  ?SUBJECTIVE:  ?  ?SUBJECTIVE STATEMENT: ?Pt reports her balance is still an issue though is improving.  She states she still needs to work on balance.  Pt reports 60-70% improvement in her balance and stability.   Pt has had one fall on 09/25/21 when she was trying to walk thru the doorway past her dog.  Pt states the imbalance comes out of nowhere.  "I feel more confident in my walking".  Pt denies any adverse effects after prior Rx.  Pt reports compliance with HEP.  She states she was able to perform her bands yesterday.  Pt states she joined [employer] and plans to start in the gym and with the pool.  Pt states she climbed her step ladder in the kitchen to reach overhead cabinet.   ?   ?  ?PERTINENT HISTORY: ?Osteoporosis, Anxiety, macular degeneration ?  ?PRECAUTIONS: Fall ?  ?WEIGHT BEARING RESTRICTIONS No ?  ? ?PLOF: Independent ?  ?PATIENT GOALS improved balance, strengthen back, and increase activity ?  ?   ?OBJECTIVE:  ?  ?DIAGNOSTIC FINDINGS: N/A ?  ?PATIENT SURVEYS:  ?ABC scale:  76% ?  ?  ? TODAY'S TREATMENT: ? ?PHYSICAL PERFORMANCE TESTING: ? ?LE MMT: ?  ?MMT Right ?09/12/2021 Left ?09/12/2021  ?Hip flexion 5/5 5/5  ?Hip extension      ?Hip abduction WFL tested in sitting WFL tested in sitting  ?Hip adduction      ?Hip internal rotation      ?Hip external rotation 4-/5 4+/5  ?Knee flexion 5/5 5/5  ?Knee extension 5/5 5/5  ?Ankle dorsiflexion 5/5 5/5  ?Ankle plantarflexion [facility] WFL  ?Ankle inversion      ?Ankle eversion      ? (Blank rows = not tested) ? ?BALANCE: ?-Pt able to perform Tandem Stance x20 sec independently without LOB.  ?-SLS Initial/Current:  R:  7/18 seconds L: 18/60 seconds ? ?TUG:  9 sec           ? ? ?Neuro Re-ed Activities: ?Pt performed: ?  Marching on airex 2x10 reps ?  Step ups on airex fwd and lateral x 10 reps each without UE assist ?  Ambulating over 3-4 hurdles with step to and reciprocal gait with CGA/SBA ?  ? ? ? Therapeutic Exercise: ?  -Reviewed response to prior Rx, current function, and HEP compliance. ?-Reviewed and updated HEP.  Gave pt a HEP  handout.  Educated pt in correct form and appropriate frequency.  Instructed pt in safety including using hands for support as appropriate.  Instructed pt she should not have pain with HEP.   ?  ?   ?PATIENT EDUCATION:  ?Education details: PT answered Pt's questions.  POC, HEP, and exercise form.  Instructed pt to not perform rows with retraction (which she is not performing) due to her rib/side pain.  ?Person educated: Patient ?Education method: Explanation, demonstration, verbal and tactile cues, handout ?Education comprehension: verbalized understanding, returned demonstration, verbal and tactile cues required, and needs further education ?  ?  ?HOME EXERCISE PROGRAM: ?Access Code: [email or web address] ?URL: [email or web address] ?Date: 10/12/2021 ?Prepared by: Ronny Flurry ? ?Exercises ?- Standing Shoulder Row with Anchored Resistance  -  1 x daily - 4-5 x weekly - 2 sets - 10 reps ?- Heel Raises with Counter Support  - 1 x daily - 5-6 x weekly - 2 sets - 10 reps ?- Toe Raises with Counter Support  - 1 x daily - 5-6 x weekly - 2 sets - 10 reps ?- Standing Tandem Balance with Counter Support  - 1 x daily - 5-6 x weekly - 2-3 reps - 20 seconds hold ?- Standing Hip Abduction with Counter Support  - 1 x daily - 4-5 x weekly - 2 sets - 10 reps ?- Standing Single Leg Stance with Counter Support  - 1 x daily - 3-4 x weekly - 2-3 reps - 10 second - 15 seconds hold ? ?  ?ASSESSMENT: ?  ?CLINICAL IMPRESSION: ?Pt has only received 3 PT visits (not counting today) and has made excellent progress.  She demonstrates improved performance on TUG with time improving from 15 sec initially to 9 sec currently.  Pt demonstrates improved balance as evidenced by subjective reports and objective findings.  She demonstrates improved performance of tandem stance and SLS.  Pt demonstrates improved L hip abd and L knee ext strength.  Pt performed balance exercises including being able to perform reciprocal stepping over hurdles well.  PT reviewed HEP and updated HEP.  Pt demonstrates good understanding of HEP.  Pt has met all goals and is ready for discharge.   ?  ?  ?OBJECTIVE IMPAIRMENTS decreased activity tolerance, decreased balance, decreased mobility, decreased strength, and pain.  ?  ?ACTIVITY LIMITATIONS cleaning.  ?  ?PERSONAL FACTORS 2 co-morbidities (macular degeneration and osteoporosis) are also affecting patient's functional outcome.  ?  ?  ?REHAB POTENTIAL: Good ?  ?CLINICAL DECISION MAKING: Stable/uncomplicated ?  ?EVALUATION COMPLEXITY: Low ?  ?  ?GOALS: ?  ?  ?SHORT TERM GOALS: ?  ?STG Name Target Date Goal status  ?1 Pt will demo improved tandem stance to 20 sec bilat for improved balance and stability with daily mobility.  ?Baseline:  10/03/2021 GOAL MET  ?2 Pt will report improved stability with performing household chores including functional lifting and  carrying objects in her home.   ?Baseline:  10/03/2021 GOAL MET  ?         ?         ?         ?         ?         ?  ?LONG TERM GOALS:  ?  ?LTG Name Target Date Goal status  ?1 Pt will be independent and compliant with HEP for improved balance, mobility, and function.  ?Baseline: 10/10/2021 GOAL MET  ?2 Pt will report at least a 65-70% improvement  in her balance and stability overall.   ?Baseline: 10/10/2021 GOAL MET  ?3 Pt will demonstrate at least a 3-5 second improvement on TUG score for improved functional mobility and reduced risk of falling.  ?Baseline: 10/10/2021 GOAL MET  ?         ?         ?         ?         ?  ?PLAN: ?PT FREQUENCY: 1 visit ?  ?PT DURATION: 1 visit ?  ?PLANNED INTERVENTIONS: Therapeutic exercises, Therapeutic activity, Neuromuscular re-education, Balance training, Gait training, Patient/Family education, Stair training, Aquatic Therapy, Cryotherapy, Moist heat, Manual therapy, and Neuro Muscular re-education ?  ?PLAN FOR NEXT SESSION: pt to be discharged from skilled PT due to meeting all goals.  Pt is agreeable with discharge and will cont with HEP.  ? ? ?PHYSICAL THERAPY DISCHARGE SUMMARY ? ?Visits from Start of Care: 4 ? ?Current functional level related to goals / functional outcomes: ?See above  ?  ?Remaining deficits: ?See above ?  ?Education / Equipment: ?See above  ? ?Patient agrees to discharge. Patient goals were met. Patient is being discharged due to meeting the stated rehab goals. ?  ?  ?  ?Selinda Michaels III PT, DPT ?10/12/21 7:36 PM ? ? ? ? ?  ? ?

## 2021-10-18 ENCOUNTER — Encounter (HOSPITAL_BASED_OUTPATIENT_CLINIC_OR_DEPARTMENT_OTHER): Payer: Medicare (Managed Care) | Admitting: Physical Therapy

## 2021-11-07 ENCOUNTER — Encounter: Payer: Self-pay | Admitting: Critical Care Medicine

## 2021-11-07 ENCOUNTER — Ambulatory Visit: Payer: Medicare (Managed Care) | Attending: Critical Care Medicine | Admitting: Critical Care Medicine

## 2021-11-07 VITALS — BP 139/82 | HR 70 | Wt 119.4 lb

## 2021-11-07 DIAGNOSIS — F411 Generalized anxiety disorder: Secondary | ICD-10-CM

## 2021-11-07 DIAGNOSIS — R296 Repeated falls: Secondary | ICD-10-CM | POA: Diagnosis not present

## 2021-11-07 DIAGNOSIS — E782 Mixed hyperlipidemia: Secondary | ICD-10-CM

## 2021-11-07 DIAGNOSIS — E039 Hypothyroidism, unspecified: Secondary | ICD-10-CM | POA: Diagnosis not present

## 2021-11-07 MED ORDER — ESCITALOPRAM OXALATE 5 MG PO TABS: 5.0000 mg | ORAL_TABLET | Freq: Every day | ORAL | 4 refills | Status: DC

## 2021-11-07 NOTE — Patient Instructions (Signed)
At the next refill you will take a single 5 mg Lexapro pill daily ? ?No other medication changes ? ?Keep your follow-up appointments with cardiology ? ?Return to see Dr. Delford Field 5 months ?

## 2021-11-07 NOTE — Assessment & Plan Note (Signed)
No change in thyroid dosing ?

## 2021-11-07 NOTE — Assessment & Plan Note (Signed)
Markedly better with physical therapy we will monitor ?

## 2021-11-07 NOTE — Assessment & Plan Note (Signed)
Has lipid panel pending with Dr. Tenny Craw in 2 weeks we will follow-up from there continue current medication simvastatin ?

## 2021-11-07 NOTE — Assessment & Plan Note (Signed)
Improved to low-dose Lexapro ?

## 2021-11-07 NOTE — Progress Notes (Signed)
? ?Established Patient Office Visit ? ?Subjective:  ?Patient ID: Ruth Porter, female    DOB: Jun 17, 1933  Age: 88 y.o. MRN: [id number] ? ?CC: Primary care follow up  ? ?HPI ?07/2021 ?Ruth Porter presents for primary care follow-up ?The patient noticed over the last month she has had 2 falls without injury.  1 when she slipped in the mud the other when she was walking her dog.  She lives in an independent apartment not in assisted living.  She is having trouble organizing her papers and letters and mail.  She denies any memory deficits.  She has not had physical therapy previously.  She is up-to-date on all her vaccines.  We did receive all her records from [place] at the last visit.  She has had all her pneumonia vaccines and flu vaccines.  She declines to receive further tetanus vaccines at this visit.  She is interested in finishing her  shingles shot series.  She had an initial injection in 2012 and has not had a follow-up injection for shingles she has upcoming appointments with cardiology in March.  She also has follow-up with her eye doctor upcoming. ? ?Patient's cholesterol is mildly elevated she is treating this with diet alone.  She is tolerating Lexapro at 5 mg daily without difficulty.  Also maintain Synthroid 50 mcg daily thyroid function was normal at the last visit. ?  ?11/07/2021 ?Patient seen in return follow-up and since the last visit she has undergone physical therapy and doing well.  She has had no further falls.  She had some numbness in the left leg when she slept on her leg and on fashion after going to bed late and sleeping in the next day.  The numbness is gone.  On arrival blood pressure 139/83.  She needs to have her lipids rechecked.  She has no other complaints at this time.  She is on the Lexapro 5 mg daily.  She is taking multivitamins and simvastatin. ? ?The patient has joined the [employer] and plans to continue with this program ? ?Past Medical History:   ?Diagnosis Date  ? Anxiety   ? Cataract   ? right  ? Diverticulosis   ? Hemorrhoids   ? Hyperlipemia   ? Hypothyroidism   ? Reflux   ? ? ?Past Surgical History:  ?Procedure Laterality Date  ? APPENDECTOMY    ? BILATERAL SALPINGOOPHORECTOMY    ? TOTAL ABDOMINAL HYSTERECTOMY    ? ? ?Family History  ?Problem Relation Age of Onset  ? Heart attack Mother   ? Stroke Mother   ? ? ?Social History  ? ?Socioeconomic History  ? Marital status: Married  ?  Spouse name: Not on file  ? Number of children: Not on file  ? Years of education: Not on file  ? Highest education level: Not on file  ?Occupational History  ? Not on file  ?Tobacco Use  ? Smoking status: Never  ? Smokeless tobacco: Never  ?Substance and Sexual Activity  ? Alcohol use: Not Currently  ? Drug use: Not Currently  ? Sexual activity: Not on file  ?Other Topics Concern  ? Not on file  ?Social History Narrative  ? Not on file  ? ?Social Determinants of Health  ? ?Financial Resource Strain: Not on file  ?Food Insecurity: Not on file  ?Transportation Needs: Not on file  ?Physical Activity: Not on file  ?Stress: Not on file  ?Social Connections: Not on file  ?Intimate Partner  Violence: Not on file  ? ? ?Outpatient Medications Prior to Visit  ?Medication Sig Dispense Refill  ? Apoaequorin (PREVAGEN PO) Take by mouth.    ? aspirin EC 81 MG tablet Take 81 mg by mouth daily. Swallow whole.    ? Cholecalciferol (VITAMIN D) 125 MCG (5000 UT) CAPS Take 5,000 Units by mouth daily.    ? levothyroxine (SYNTHROID) 50 MCG tablet Take 1 tablet (50 mcg total) by mouth daily. 60 tablet 4  ? Multiple Vitamins-Minerals (CENTRUM ULTRA WOMENS PO) Take by mouth.    ? Multiple Vitamins-Minerals (PRESERVISION/LUTEIN PO) Take by mouth.    ? simvastatin (ZOCOR) 20 MG tablet Take 1 tablet (20 mg total) by mouth at bedtime. 90 tablet 3  ? escitalopram (LEXAPRO) 10 MG tablet Take 5 mg by mouth daily.    ? escitalopram (LEXAPRO) 5 MG tablet Take 1 tablet (5 mg total) by mouth daily. 60 tablet 2   ? chlorhexidine (PERIDEX) 0.12 % solution daily.    ? escitalopram (LEXAPRO) 10 MG tablet Take by mouth.    ? ?No facility-administered medications prior to visit.  ? ? ?Allergies  ?Allergen Reactions  ? Statins Other (See Comments)  ?  Vision changes  ? Sulfa Antibiotics Other (See Comments)  ?  Pt allergic to sulfa but does not know reaction  ? ? ?ROS ?Review of Systems  ?Constitutional:  Negative for chills, diaphoresis and fever.  ?HENT:  Negative for congestion, hearing loss, nosebleeds, sore throat and tinnitus.   ?Eyes:  Negative for photophobia and redness.  ?Respiratory:  Negative for cough, shortness of breath, wheezing and stridor.   ?Cardiovascular:  Negative for chest pain, palpitations and leg swelling.  ?Gastrointestinal:  Negative for abdominal pain, blood in stool, constipation, diarrhea, nausea and vomiting.  ?Endocrine: Negative for polydipsia.  ?Genitourinary:  Negative for dysuria, flank pain, frequency, hematuria and urgency.  ?Musculoskeletal:  Negative for back pain, myalgias and neck pain.  ?Skin:  Negative for rash.  ?Allergic/Immunologic: Negative for environmental allergies.  ?Neurological:  Negative for dizziness, tremors, seizures, weakness and headaches.  ?     Balance issues with falls  ?Hematological:  Does not bruise/bleed easily.  ?Psychiatric/Behavioral:  Negative for suicidal ideas. The patient is not nervous/anxious.   ? ?  ?Objective:  ?  ?Physical Exam ?Vitals reviewed.  ?Constitutional:   ?   Appearance: Normal appearance. She is well-developed. She is not diaphoretic.  ?HENT:  ?   Head: Normocephalic and atraumatic.  ?   Nose: No nasal deformity, septal deviation, mucosal edema or rhinorrhea.  ?   Right Sinus: No maxillary sinus tenderness or frontal sinus tenderness.  ?   Left Sinus: No maxillary sinus tenderness or frontal sinus tenderness.  ?   Mouth/Throat:  ?   Pharynx: No oropharyngeal exudate.  ?Eyes:  ?   General: No scleral icterus. ?   Conjunctiva/sclera:  Conjunctivae normal.  ?   Pupils: Pupils are equal, round, and reactive to light.  ?Neck:  ?   Thyroid: No thyromegaly.  ?   Vascular: No carotid bruit or JVD.  ?   Trachea: Trachea normal. No tracheal tenderness or tracheal deviation.  ?Cardiovascular:  ?   Rate and Rhythm: Normal rate and regular rhythm.  ?   Chest Wall: PMI is not displaced.  ?   Pulses: Normal pulses. No decreased pulses.  ?   Heart sounds: Normal heart sounds, S1 normal and S2 normal. Heart sounds not distant. No murmur heard. ?No systolic murmur is  present.  ?No diastolic murmur is present.  ?  No friction rub. No gallop. No S3 or S4 sounds.  ?Pulmonary:  ?   Effort: No tachypnea, accessory muscle usage or respiratory distress.  ?   Breath sounds: No stridor. No decreased breath sounds, wheezing, rhonchi or rales.  ?Chest:  ?   Chest wall: No tenderness.  ?Abdominal:  ?   General: Bowel sounds are normal. There is no distension.  ?   Palpations: Abdomen is soft. Abdomen is not rigid.  ?   Tenderness: There is no abdominal tenderness. There is no guarding or rebound.  ?Musculoskeletal:     ?   General: Normal range of motion.  ?   Cervical back: Normal range of motion and neck supple. No edema, erythema or rigidity. No muscular tenderness. Normal range of motion.  ?Lymphadenopathy:  ?   Head:  ?   Right side of head: No submental or submandibular adenopathy.  ?   Left side of head: No submental or submandibular adenopathy.  ?   Cervical: No cervical adenopathy.  ?Skin: ?   General: Skin is warm and dry.  ?   Coloration: Skin is not pale.  ?   Findings: No rash.  ?   Nails: There is no clubbing.  ?Neurological:  ?   General: No focal deficit present.  ?   Mental Status: She is alert and oriented to person, place, and time. Mental status is at baseline.  ?   Cranial Nerves: No cranial nerve deficit.  ?   Sensory: No sensory deficit.  ?   Motor: No weakness.  ?   Coordination: Coordination normal.  ?   Gait: Gait normal.  ?   Deep Tendon Reflexes:  Reflexes normal.  ?   Comments: Get up and go was performed and she is markedly better compared to the last visit due to physical therapy and improved balance  ?Psychiatric:     ?   Speech: Speech normal.     ?

## 2021-11-20 ENCOUNTER — Other Ambulatory Visit: Payer: Medicare (Managed Care) | Admitting: *Deleted

## 2021-11-20 DIAGNOSIS — Z79899 Other long term (current) drug therapy: Secondary | ICD-10-CM

## 2021-11-20 DIAGNOSIS — E785 Hyperlipidemia, unspecified: Secondary | ICD-10-CM

## 2021-11-20 LAB — LIPID PANEL
Chol/HDL Ratio: 2.4 ratio (ref 0.0–4.4)
Cholesterol, Total: 133 mg/dL (ref 100–199)
HDL: 56 mg/dL (ref >39)
LDL Chol Calc (NIH): 62 mg/dL (ref 0–99)
Triglycerides: 76 mg/dL (ref 0–149)
VLDL Cholesterol Cal: 15 mg/dL (ref 5–40)

## 2021-11-20 LAB — HEPATIC FUNCTION PANEL
ALT: 16 IU/L (ref 0–32)
AST: 26 IU/L (ref 0–40)
Albumin: 4.3 g/dL (ref 3.6–4.6)
Alkaline Phosphatase: 100 IU/L (ref 44–121)
Bilirubin Total: 0.5 mg/dL (ref 0.0–1.2)
Bilirubin, Direct: 0.15 mg/dL (ref 0.00–0.40)
Total Protein: 6.5 g/dL (ref 6.0–8.5)

## 2022-04-10 ENCOUNTER — Ambulatory Visit: Payer: Medicare (Managed Care) | Admitting: Critical Care Medicine

## 2022-04-10 NOTE — Progress Notes (Deleted)
Established Patient Office Visit  Subjective:  Patient ID: Ruth Porter, female    DOB: 1932/10/11  Age: 88 y.o. MRN: [id number]  CC: Primary care follow up   HPI 07/2021 Ruth Porter presents for primary care follow-up The patient noticed over the last month she has had 2 falls without injury.  1 when she slipped in the mud the other when she was walking her dog.  She lives in an independent apartment not in assisted living.  She is having trouble organizing her papers and letters and mail.  She denies any memory deficits.  She has not had physical therapy previously.  She is up-to-date on all her vaccines.  We did receive all her records from [facility] at the last visit.  She has had all her pneumonia vaccines and flu vaccines.  She declines to receive further tetanus vaccines at this visit.  She is interested in finishing her  shingles shot series.  She had an initial injection in 2012 and has not had a follow-up injection for shingles she has upcoming appointments with cardiology in March.  She also has follow-up with her eye doctor upcoming.  Patient's cholesterol is mildly elevated she is treating this with diet alone.  She is tolerating Lexapro at 5 mg daily without difficulty.  Also maintain Synthroid 50 mcg daily thyroid function was normal at the last visit.   11/07/2021 Patient seen in return follow-up and since the last visit she has undergone physical therapy and doing well.  She has had no further falls.  She had some numbness in the left leg when she slept on her leg and on fashion after going to bed late and sleeping in the next day.  The numbness is gone.  On arrival blood pressure 139/83.  She needs to have her lipids rechecked.  She has no other complaints at this time.  She is on the Lexapro 5 mg daily.  She is taking multivitamins and simvastatin.  The patient has joined the [place] [employer] and plans to continue with this program  9/26    Endocrine    Hypothyroidism    No change in thyroid dosing         Other   Mixed hyperlipidemia    Has lipid panel pending with Dr. Harrington Challenger in 2 weeks we will follow-up from there continue current medication simvastatin       Generalized anxiety disorder    Improved to low-dose Lexapro       Relevant Medications   escitalopram (LEXAPRO) 5 MG tablet   Frequent falls    Markedly better with physical therapy we will monitor       Meds ordered this encounter  Medications   escitalopram (LEXAPRO) 5 MG tablet    Sig: Take 1 tablet (5 mg total) by mouth daily.    Dispense:  30 tablet    Refill:  4  Patient is interested in receiving her completion shingles vaccine series therefore a prescription printed and given to her to obtain this at her [place] which will complete her series as the first injection was given in 2012  Past Medical History:  Diagnosis Date   Anxiety    Cataract    right   Diverticulosis    Hemorrhoids    Hyperlipemia    Hypothyroidism    Reflux     Past Surgical History:  Procedure Laterality Date   APPENDECTOMY     BILATERAL SALPINGOOPHORECTOMY  TOTAL ABDOMINAL HYSTERECTOMY      Family History  Problem Relation Age of Onset   Heart attack Mother    Stroke Mother     Social History   Socioeconomic History   Marital status: Married    Spouse name: Not on file   Number of children: Not on file   Years of education: Not on file   Highest education level: Not on file  Occupational History   Not on file  Tobacco Use   Smoking status: Never   Smokeless tobacco: Never  Substance and Sexual Activity   Alcohol use: Not Currently   Drug use: Not Currently   Sexual activity: Not on file  Other Topics Concern   Not on file  Social History Narrative   Not on file   Social Determinants of Health   Financial Resource Strain: Not on file  Food Insecurity: Not on file  Transportation Needs: Not on file  Physical Activity: Not on file   Stress: Not on file  Social Connections: Not on file  Intimate Partner Violence: Not on file    Outpatient Medications Prior to Visit  Medication Sig Dispense Refill   Apoaequorin (PREVAGEN PO) Take by mouth.     aspirin EC 81 MG tablet Take 81 mg by mouth daily. Swallow whole.     chlorhexidine (PERIDEX) 0.12 % solution daily.     Cholecalciferol (VITAMIN D) 125 MCG (5000 UT) CAPS Take 5,000 Units by mouth daily.     escitalopram (LEXAPRO) 5 MG tablet Take 1 tablet (5 mg total) by mouth daily. 30 tablet 4   levothyroxine (SYNTHROID) 50 MCG tablet Take 1 tablet (50 mcg total) by mouth daily. 60 tablet 4   Multiple Vitamins-Minerals (CENTRUM ULTRA WOMENS PO) Take by mouth.     Multiple Vitamins-Minerals (PRESERVISION/LUTEIN PO) Take by mouth.     simvastatin (ZOCOR) 20 MG tablet Take 1 tablet (20 mg total) by mouth at bedtime. 90 tablet 3   No facility-administered medications prior to visit.    Allergies  Allergen Reactions   Statins Other (See Comments)    Vision changes   Sulfa Antibiotics Other (See Comments)    Pt allergic to sulfa but does not know reaction    ROS Review of Systems  Constitutional:  Negative for chills, diaphoresis and fever.  HENT:  Negative for congestion, hearing loss, nosebleeds, sore throat and tinnitus.   Eyes:  Negative for photophobia and redness.  Respiratory:  Negative for cough, shortness of breath, wheezing and stridor.   Cardiovascular:  Negative for chest pain, palpitations and leg swelling.  Gastrointestinal:  Negative for abdominal pain, blood in stool, constipation, diarrhea, nausea and vomiting.  Endocrine: Negative for polydipsia.  Genitourinary:  Negative for dysuria, flank pain, frequency, hematuria and urgency.  Musculoskeletal:  Negative for back pain, myalgias and neck pain.  Skin:  Negative for rash.  Allergic/Immunologic: Negative for environmental allergies.  Neurological:  Negative for dizziness, tremors, seizures, weakness  and headaches.       Balance issues with falls  Hematological:  Does not bruise/bleed easily.  Psychiatric/Behavioral:  Negative for suicidal ideas. The patient is not nervous/anxious.       Objective:    Physical Exam Vitals reviewed.  Constitutional:      Appearance: Normal appearance. She is well-developed. She is not diaphoretic.  HENT:     Head: Normocephalic and atraumatic.     Nose: No nasal deformity, septal deviation, mucosal edema or rhinorrhea.     Right  Sinus: No maxillary sinus tenderness or frontal sinus tenderness.     Left Sinus: No maxillary sinus tenderness or frontal sinus tenderness.     Mouth/Throat:     Pharynx: No oropharyngeal exudate.  Eyes:     General: No scleral icterus.    Conjunctiva/sclera: Conjunctivae normal.     Pupils: Pupils are equal, round, and reactive to light.  Neck:     Thyroid: No thyromegaly.     Vascular: No carotid bruit or JVD.     Trachea: Trachea normal. No tracheal tenderness or tracheal deviation.  Cardiovascular:     Rate and Rhythm: Normal rate and regular rhythm.     Chest Wall: PMI is not displaced.     Pulses: Normal pulses. No decreased pulses.     Heart sounds: Normal heart sounds, S1 normal and S2 normal. Heart sounds not distant. No murmur heard.    No systolic murmur is present.     No diastolic murmur is present.     No friction rub. No gallop. No S3 or S4 sounds.  Pulmonary:     Effort: No tachypnea, accessory muscle usage or respiratory distress.     Breath sounds: No stridor. No decreased breath sounds, wheezing, rhonchi or rales.  Chest:     Chest wall: No tenderness.  Abdominal:     General: Bowel sounds are normal. There is no distension.     Palpations: Abdomen is soft. Abdomen is not rigid.     Tenderness: There is no abdominal tenderness. There is no guarding or rebound.  Musculoskeletal:        General: Normal range of motion.     Cervical back: Normal range of motion and neck supple. No edema,  erythema or rigidity. No muscular tenderness. Normal range of motion.  Lymphadenopathy:     Head:     Right side of head: No submental or submandibular adenopathy.     Left side of head: No submental or submandibular adenopathy.     Cervical: No cervical adenopathy.  Skin:    General: Skin is warm and dry.     Coloration: Skin is not pale.     Findings: No rash.     Nails: There is no clubbing.  Neurological:     General: No focal deficit present.     Mental Status: She is alert and oriented to person, place, and time. Mental status is at baseline.     Cranial Nerves: No cranial nerve deficit.     Sensory: No sensory deficit.     Motor: No weakness.     Coordination: Coordination normal.     Gait: Gait normal.     Deep Tendon Reflexes: Reflexes normal.     Comments: Get up and go was performed and she is markedly better compared to the last visit due to physical therapy and improved balance  Psychiatric:        Speech: Speech normal.        Behavior: Behavior normal.     There were no vitals taken for this visit. Wt Readings from Last 3 Encounters:  11/07/21 119 lb 6.4 oz (54.2 kg)  09/25/21 118 lb (53.5 kg)  08/15/21 124 lb 3.2 oz (56.3 kg)     Health Maintenance Due  Topic Date Due   Zoster Vaccines- Shingrix (1 of 2) Never done   INFLUENZA VACCINE  02/13/2022    There are no preventive care reminders to display for this patient.  Lab Results  Component  Value Date   TSH 4.170 05/31/2021   Lab Results  Component Value Date   WBC 4.9 05/31/2021   HGB 12.5 05/31/2021   HCT 39.3 05/31/2021   MCV 82 05/31/2021   PLT 239 05/31/2021   Lab Results  Component Value Date   NA 144 05/31/2021   K 4.2 05/31/2021   CO2 25 05/31/2021   GLUCOSE 91 05/31/2021   BUN 17 05/31/2021   CREATININE 0.89 05/31/2021   BILITOT 0.5 11/20/2021   ALKPHOS 100 11/20/2021   AST 26 11/20/2021   ALT 16 11/20/2021   PROT 6.5 11/20/2021   ALBUMIN 4.3 11/20/2021   CALCIUM 9.9  05/31/2021   ANIONGAP 8 12/14/2020   EGFR 63 05/31/2021   Lab Results  Component Value Date   CHOL 133 11/20/2021   Lab Results  Component Value Date   HDL 56 11/20/2021   Lab Results  Component Value Date   LDLCALC 62 11/20/2021   Lab Results  Component Value Date   TRIG 76 11/20/2021   Lab Results  Component Value Date   CHOLHDL 2.4 11/20/2021   No results found for: "HGBA1C"    Assessment & Plan:   Problem List Items Addressed This Visit   None No orders of the defined types were placed in this encounter. Patient is interested in receiving her completion shingles vaccine series therefore a prescription printed and given to her to obtain this at her [place] which will complete her series as the first injection was given in 2012  Follow-up: No follow-ups on file.    Asencion Noble, MD

## 2022-04-26 ENCOUNTER — Other Ambulatory Visit: Payer: Self-pay | Admitting: Critical Care Medicine

## 2022-04-26 ENCOUNTER — Telehealth: Payer: Self-pay

## 2022-04-26 NOTE — Telephone Encounter (Signed)
Ardyth Harps, a pharmacist from [facility] called and stated pt stopped taking Simvastatin on her own because it was making her sick.  She just wanted to make Dr Harrington Challenger aware of this change. Thanks Wynona Meals

## 2022-05-02 NOTE — Telephone Encounter (Signed)
I spoke with the pt and she reports that she has stopped Zocor for several months due to reading that it can precipitate Alzheimer's.... she say she also refuses Lipitor for the same information.   I advised the pt that Dr Harrington Challenger has been using other Chol meds that are better tolerated but she declines and says that she eats well and would like to not take anything else at this time... I made her a recall appt with Dr Harrington Challenger for for 09/24/22.  I will forward to Dr Harrington Challenger for her review.

## 2022-05-09 ENCOUNTER — Other Ambulatory Visit: Payer: Self-pay

## 2022-05-09 ENCOUNTER — Encounter (HOSPITAL_BASED_OUTPATIENT_CLINIC_OR_DEPARTMENT_OTHER): Payer: Self-pay

## 2022-05-09 ENCOUNTER — Emergency Department (HOSPITAL_BASED_OUTPATIENT_CLINIC_OR_DEPARTMENT_OTHER)
Admission: EM | Admit: 2022-05-09 | Discharge: 2022-05-09 | Disposition: A | Discharge status: Home / Self Care | Payer: Medicare (Managed Care) | Attending: Emergency Medicine | Admitting: Emergency Medicine

## 2022-05-09 DIAGNOSIS — E039 Hypothyroidism, unspecified: Secondary | ICD-10-CM | POA: Diagnosis not present

## 2022-05-09 DIAGNOSIS — H6122 Impacted cerumen, left ear: Secondary | ICD-10-CM | POA: Insufficient documentation

## 2022-05-09 DIAGNOSIS — Z79899 Other long term (current) drug therapy: Secondary | ICD-10-CM | POA: Insufficient documentation

## 2022-05-09 DIAGNOSIS — Z7982 Long term (current) use of aspirin: Secondary | ICD-10-CM | POA: Diagnosis not present

## 2022-05-09 MED ORDER — DOCUSATE SODIUM 50 MG/5ML PO LIQD
50.0000 mg | Freq: Once | ORAL | Status: DC
  Filled 2022-05-09: qty 10

## 2022-05-09 MED ORDER — CARBAMIDE PEROXIDE 6.5 % OT SOLN: 5.0000 [drp] | Freq: Two times a day (BID) | OTIC | 0 refills | Status: DC

## 2022-05-09 MED ORDER — CARBAMIDE PEROXIDE 6.5 % OT SOLN: 5.0000 [drp] | Freq: Once | OTIC | Status: DC

## 2022-05-09 NOTE — ED Notes (Signed)
RN provided AVS using Teachback Method. Patient verbalizes understanding of Discharge Instructions. Opportunity for Questioning and Answers were provided by RN. Patient Discharged from ED ambulatory to Home with Family. ? ?

## 2022-05-09 NOTE — ED Notes (Signed)
Patient visualized exiting Department stating she is leaving.

## 2022-05-09 NOTE — ED Triage Notes (Signed)
Patient here POV from Home.  Endorses being seen recently for Possible Hearing Aid Placement and being told it could not be completed due to Ear Wax in Left Ear. Could not be completed then and was told by other Physicians she could not be seen until December.   NAD Noted during Triage. A&Ox4. GCS 15. Ambulatory.

## 2022-05-09 NOTE — Discharge Instructions (Signed)
Use Debrox drops as prescribed.  Call and schedule an appointment with ENT.  Return for new or concerning symptoms.

## 2022-05-09 NOTE — ED Provider Notes (Signed)
[facility] EMERGENCY DEPT Provider Note   CSN: [id number] Arrival date & time: 05/09/22  1620     History  Chief Complaint  Patient presents with   Ear Fullness    Ruth Porter is a 88 y.o. female.   Ear Fullness     Patient with history of hyperlipidemia, anxiety, hypothyroid presents today due to left ear fullness.  This been going on for a few weeks, went to see her audiologist and told they cannot fit her for new hearing aids secondary to cerumen impaction.  Patient has not tried any drops or anything over-the-counter.  She can try to get in with her primary and her ENT doctor who are not able to see her until December.  Denies any pain, fevers, facial swelling.  Home Medications Prior to Admission medications   Medication Sig Start Date End Date Taking? Authorizing Provider  carbamide peroxide (DEBROX) 6.5 % OTIC solution Place 5 drops into the left ear 2 (two) times daily. 05/09/22  Yes Nadea Kirkland, Hildred Alamin, PA-C  Apoaequorin (PREVAGEN PO) Take by mouth.    [provider]  aspirin EC 81 MG tablet Take 81 mg by mouth daily. Swallow whole.    [provider]  chlorhexidine (PERIDEX) 0.12 % solution daily. 06/24/21   [provider]  Cholecalciferol (VITAMIN D) 125 MCG (5000 UT) CAPS Take 5,000 Units by mouth daily.    [provider]  escitalopram (LEXAPRO) 10 MG tablet Take by mouth. 03/30/22   [provider]  levothyroxine (SYNTHROID) 50 MCG tablet TAKE 1 TABLET(50 MCG) BY MOUTH DAILY 04/26/22   Elsie Stain, MD  Multiple Vitamins-Minerals (CENTRUM ULTRA WOMENS PO) Take by mouth.    [provider]  Multiple Vitamins-Minerals (PRESERVISION/LUTEIN PO) Take by mouth.    [provider]      Allergies    Statins and Sulfa antibiotics    Review of Systems   Review of Systems  Physical Exam Updated Vital Signs BP (!) 142/96 (BP Location: Right Arm)   Pulse 72   Temp 97.9 F (36.6 C)  (Oral)   Resp 18   Ht 5\' 3"  (1.6 m)   Wt 54.2 kg   SpO2 98%   BMI 21.17 kg/m  Physical Exam Vitals and nursing note reviewed. Exam conducted with a chaperone present.  Constitutional:      General: She is not in acute distress.    Appearance: Normal appearance.  HENT:     Head: Normocephalic and atraumatic.     Left Ear: There is impacted cerumen.     Ears:     Comments: No mastoid swelling, no tragal tenderness.  No TMJ tenderness or laxity Eyes:     General: No scleral icterus.    Extraocular Movements: Extraocular movements intact.     Pupils: Pupils are equal, round, and reactive to light.  Skin:    Coloration: Skin is not jaundiced.  Neurological:     Mental Status: She is alert. Mental status is at baseline.     Coordination: Coordination normal.     ED Results / Procedures / Treatments   Labs (all labs ordered are listed, but only abnormal results are displayed) Labs Reviewed - No data to display  EKG None  Radiology No results found.  Procedures Procedures    Medications Ordered in ED Medications  carbamide peroxide (DEBROX) 6.5 % OTIC (EAR) solution 5 drop (5 drops Both EARS Not Given 05/09/22 1843)  docusate (COLACE) 50 MG/5ML liquid 50 mg (  50 mg Oral Not Given 05/09/22 1843)    ED Course/ Medical Decision Making/ A&P                           Medical Decision Making Risk OTC drugs.   Patient presents due to left cerumen impaction.  There is no facial swelling, tragal tenderness, erythema or signs of mastoiditis.  I ordered Colace given we do have Debrox in the ED.  Slight improvement but not fully resolved..  We will send patient home with Debrox and ENT follow-up.  Do not think any additional work-up, imaging indicated at this time.  Return precaution discussed, discharged stable condition.        Final Clinical Impression(s) / ED Diagnoses Final diagnoses:  Impacted cerumen of left ear    Rx / DC Orders ED Discharge Orders           Ordered    carbamide peroxide (DEBROX) 6.5 % OTIC solution  2 times daily        05/09/22 [address] [place] 05/09/22 1925    Pricilla Loveless, MD 05/11/22 1537

## 2022-05-15 ENCOUNTER — Telehealth: Payer: Self-pay | Admitting: Critical Care Medicine

## 2022-05-15 NOTE — Telephone Encounter (Signed)
This pt called me and needs her ears cleaned out  she has an appt with audiology next week, they do not do this for her  Can we get her in for a nurse visit to get this done carly???

## 2022-05-18 ENCOUNTER — Ambulatory Visit: Payer: Medicare (Managed Care) | Attending: Family Medicine | Admitting: *Deleted

## 2022-05-18 DIAGNOSIS — H6122 Impacted cerumen, left ear: Secondary | ICD-10-CM | POA: Diagnosis not present

## 2022-05-18 NOTE — Telephone Encounter (Signed)
Called and scheduled patient for this afternoon

## 2022-05-21 ENCOUNTER — Other Ambulatory Visit: Payer: Self-pay

## 2022-05-21 ENCOUNTER — Telehealth: Payer: Self-pay | Admitting: Emergency Medicine

## 2022-05-21 ENCOUNTER — Encounter: Payer: Self-pay | Admitting: *Deleted

## 2022-05-21 DIAGNOSIS — H6122 Impacted cerumen, left ear: Secondary | ICD-10-CM

## 2022-05-21 NOTE — Progress Notes (Unsigned)
Please advise on referral-  Patient arrived on November 3rd for ear lavage d/t cerumen impaction of left ear.  Per patient, she call PCP on the phone and asked about having ear wax removed.    During procedure, patient expresses that she had this procedure had been unsuccessful at [facility] facility. [place], [place] or Probation officer of note were un successful to getting cerumen out of left ear.  Patient tolerated procedure well. No dizziness or discomfort was experienced during lavage.    Patient is requesting a referral to ENT for cerumen impaction. She is having hearing aids placed and would like ear wax removed before placement.

## 2022-05-21 NOTE — Progress Notes (Signed)
Patient arrived on November 3rd for ear lavage d/t cerumen impaction of left ear.  Per patient, she call PCP on the phone and asked about having ear wax removed.   During procedure, patient expresses that she had this procedure had been unsuccessful at [facility] facility. [place], [place] or Probation officer of note were un successful to getting cerumen out of left ear.  Patient tolerated procedure well. No dizziness or discomfort was experienced during lavage.   Patient is requesting a referral to ENT for cerumen impaction. She is having hearing aids placed and would like ear wax removed before placement.

## 2022-05-21 NOTE — Telephone Encounter (Signed)
Copied from [place] [id number]. Topic: General - Other >> May 21, 2022 10:01 AM [place] B wrote: Reason for [id number] daughter called in asking if Dr Joya Gaskins uses a special instrument t clean wax out of patient's ear or would she have to be referred. Please cal back

## 2022-05-22 NOTE — Telephone Encounter (Signed)
Garlon Hatchet from ENT ,,called in states patient will be seeing Dr Wilburn Cornelia on 11/28 and he does use a special instrument to clean ear nose and throat.

## 2022-05-22 NOTE — Telephone Encounter (Signed)
Called patient, I sent in a referral for ent and she has appt. I called the ent to make sure that's that they are using the instruments to clean ear out instead of just water(like we did) I left a vm with the practice and the pt is aware also.

## 2022-05-22 NOTE — Progress Notes (Signed)
Please place referral to ENT for cerumen removal.  The nature of this note does not allow me to place any orders.

## 2022-06-28 ENCOUNTER — Other Ambulatory Visit: Payer: Self-pay | Admitting: Critical Care Medicine

## 2022-08-10 ENCOUNTER — Other Ambulatory Visit: Payer: Self-pay | Admitting: Critical Care Medicine

## 2022-09-08 ENCOUNTER — Other Ambulatory Visit: Payer: Self-pay | Admitting: Critical Care Medicine

## 2022-09-10 NOTE — Telephone Encounter (Signed)
Requested medications are due for refill today.  yes  Requested medications are on the active medications list.  yes  Last refill. 08/10/2022 #30 0 rf  Future visit scheduled.   no  Notes to clinic.  Labs are expired.    Requested Prescriptions  Pending Prescriptions Disp Refills   levothyroxine (SYNTHROID) 50 MCG tablet [Pharmacy Med Name: LEVOTHYROXINE 0.'05MG'$  (50MCG) TAB] 30 tablet 0    Sig: TAKE 1 TABLET(50 MCG) BY MOUTH DAILY     Endocrinology:  Hypothyroid Agents Failed - 09/08/2022  7:29 PM      Failed - TSH in normal range and within 360 days    TSH  Date Value Ref Range Status  05/31/2021 4.170 0.450 - 4.500 uIU/mL Final         Passed - Valid encounter within last 12 months    Recent Outpatient Visits           10 months ago Hypothyroidism, unspecified type   [place] Elsie Stain, MD   1 year ago Frequent falls   [place] Elsie Stain, MD   1 year ago Mixed hyperlipidemia   [place], MD       Future Appointments             In 2 weeks Fay Records, MD [place] [facility], [place]

## 2022-09-12 ENCOUNTER — Other Ambulatory Visit: Payer: Self-pay | Admitting: Critical Care Medicine

## 2022-09-23 NOTE — Progress Notes (Deleted)
Cardiology Office Note   Date:  09/23/2022   ID:  Ruth, Porter 21-Jun-1933, MRN [id number]  PCP:  Elsie Stain, MD  Cardiologist:   Dorris Carnes, MD   Pt presents for f/u of CP    History of Present Illness: Ruth Porter is a 89 y.o. female with hx of  Hypothyroidism. Ad CP   REmote stress test    No known CAD     I saw the pt in June 2022   She had been in urgent care   Seen for weakess.   Also developed some chest pain, R jaw pain ON 12/14/20 seen in ED for HTN and fatigue, dizziness   Labs and EKG negative   Given age, FHx of CAD, fatigue, I recomm a coronary CT angio   See below   Flow limiting dz noted in Distal LCx   REcomm medical Rx, no intervention I placed her on Crestor   She did not tolerate   Developed eye complaints   In Nov the pt was seen by Tia Masker   Since June 2022 the pt denies CP   She says she feels tired at times   IN Dec she thinks she over did things   Was "zonked" in Jan 2023   Starting to feel better   I saw the pt in March 2023   No outpatient medications have been marked as taking for the 09/24/22 encounter (Appointment) with Fay Records, MD.     Allergies:   Statins and Sulfa antibiotics   Past Medical History:  Diagnosis Date   Anxiety    Cataract    right   Diverticulosis    Hemorrhoids    Hyperlipemia    Hypothyroidism    Reflux     Past Surgical History:  Procedure Laterality Date   APPENDECTOMY     BILATERAL SALPINGOOPHORECTOMY     TOTAL ABDOMINAL HYSTERECTOMY       Social History:  The patient  reports that she has never smoked. She has never used smokeless tobacco. She reports that she does not currently use alcohol. She reports that she does not currently use drugs.   Family History:  The patient's family history includes Heart attack in her mother; Stroke in her mother.    ROS:  Please see the history of present illness. All other systems are reviewed and  Negative to the above problem except as noted.     PHYSICAL EXAM: VS:  There were no vitals taken for this visit.   GEN: Well nourished, well developed, NAD   HEENT: normal  Neck: no JVD, carotid bruits, or masses Cardiac: RRR; no murmurs,no LE edema  Respiratory:  clear to auscultation bilaterally GI: soft, nontender, nondistended, + BS  No hepatomegaly  MS: no deformity Moving all extremities   Skin: warm and dry, no rash Neuro:  Strength and sensation are intact Psych: euthymic mood, full affect   EKG:  EKG shows SR 65 bpm    CT coronry angiogram  IMPRESSION: 1. No acute cardiopulmonary abnormalities. 2. Peripheral interstitial reticulation and ground-glass attenuation with scattered parenchymal bands identified within both lower lobes. Findings are favored to represent sequelae of inflammation or infection. 3. 6 mm subpleural nodule within the posterolateral right base is identified. Non-contrast chest CT at 6-12 months is recommended. If the nodule is stable at time of repeat CT, then future CT at 18-24 months (from today's scan) is considered optional for low-risk patients, but is  recommended for high-risk patients. This recommendation follows the consensus statement: Guidelines for Management of Incidental Pulmonary Nodules Detected on CT Images: From the Fleischner Society 2017; Radiology 2017; 284:228-243.   IMPRESSION: 1. Coronary calcium score of 147. This was 48th percentile for similar age, and same sex, and race matched control.   2. Normal coronary origin with right dominance.   3. CAD-RADS 3. Moderate stenosis- this may be overestimated in the setting of decreased nitroglycerin administration. Consider symptom-guided anti-ischemic pharmacotherapy as well as risk factor modification per guideline directed care. Additional analysis with CT FFR will be submitted.   4. Mild dilation of the ascending aorta: 41 mm.   5. Aortic atherosclerosis.   6. Mild aortic valve calcification- calcium score  360.   7. Small PFO visualized.     FFR  01/12/21 1. Left Main: No significant functional stenosis, CT-FFR 0.94.   2. LAD: No significant functional stenosis, CT-FFR 0.86. 3. LCX: significant functional stenosis only in the distal vessel, CT-FFR. 0.79 distal vessel. 4. RCA: No significant functional stenosis, CT-FFR 0.90.   IMPRESSION: 1. CT FFR analysis shows evidence of significant functional stenosis in the distal circumflex vessel.   2. Suboptimal nitroglycerin administration may increase the chance of false positive flow limiting lesions.   Rudean Haskell MD   Lipid Panel    Component Value Date/Time   CHOL 133 11/20/2021 1059   TRIG 76 11/20/2021 1059   HDL 56 11/20/2021 1059   CHOLHDL 2.4 11/20/2021 1059   LDLCALC 62 11/20/2021 1059      Wt Readings from Last 3 Encounters:  05/09/22 119 lb 7.8 oz (54.2 kg)  11/07/21 119 lb 6.4 oz (54.2 kg)  09/25/21 118 lb (53.5 kg)      ASSESSMENT AND PLAN:  1  CAD   Pt with CAD as noted above   I am not convinced of active ischemia   Follow      2   HL    LDL 119 in Nove 2022    The pt should have tighte control of lipids and be on a statin given CAD    She did not tolerate Crestor   Does not want lipitor (husband had problems)   Will try simvistatin   Follow up lipids with liver in 8 wks     3  Thyroid  Continue synthroid    F/U next fall   Current medicines are reviewed at length with the patient today.  The patient does not have concerns regarding medicines.  Signed, Dorris Carnes, MD  09/23/2022 8:40 PM    [facility] [place], [place], [place]  [id number] Phone: [phone]; Fax: [phone]

## 2022-09-24 ENCOUNTER — Ambulatory Visit: Payer: Medicare (Managed Care) | Admitting: Internal Medicine

## 2022-09-25 ENCOUNTER — Telehealth: Payer: Self-pay | Admitting: Critical Care Medicine

## 2022-09-25 NOTE — Telephone Encounter (Signed)
Contacted Ruth Porter to schedule their annual wellness visit. Appointment made for 10/11/22.  Ruth Porter AWV direct phone # [phone]

## 2022-10-11 ENCOUNTER — Ambulatory Visit: Payer: Medicare (Managed Care) | Attending: Critical Care Medicine

## 2022-10-11 VITALS — Ht 63.0 in | Wt 118.0 lb

## 2022-10-11 DIAGNOSIS — Z0000 Encounter for general adult medical examination without abnormal findings: Secondary | ICD-10-CM | POA: Diagnosis not present

## 2022-10-11 NOTE — Progress Notes (Signed)
I connected with  Ruth Porter on 10/11/22 by a audio enabled telemedicine application and verified that I am speaking with the correct person using two identifiers.  Patient Location: Home  Provider Location: Office/Clinic  I discussed the limitations of evaluation and management by telemedicine. The patient expressed understanding and agreed to proceed.  Subjective:   Ruth Porter is a 89 y.o. female who presents for Medicare Annual (Subsequent) preventive examination.  Review of Systems     Cardiac Risk Factors include: advanced age (>28men, >70 women)     Objective:    Today's Vitals   10/11/22 1031  Weight: 118 lb (53.5 kg)  Height: 5\' 3"  (1.6 m)   Body mass index is 20.9 kg/m.     10/11/2022   10:39 AM 05/09/2022    4:50 PM 09/12/2021    1:35 PM 12/14/2020    4:28 PM  Advanced Directives  Does Patient Have a Medical Advance Directive? No No Yes Yes  Type of Comptroller;Living will   Does patient want to make changes to medical advance directive?    No - Patient declined  Would patient like information on creating a medical advance directive?  No - Patient declined      Current Medications (verified) Outpatient Encounter Medications as of 10/11/2022  Medication Sig   Apoaequorin (PREVAGEN PO) Take by mouth.   Cholecalciferol (VITAMIN D) 125 MCG (5000 UT) CAPS Take 5,000 Units by mouth daily.   levothyroxine (SYNTHROID) 50 MCG tablet Take 1 tablet (50 mcg total) by mouth daily before breakfast. Please make appointment with your PCP, Dr. Joya Gaskins.   Multiple Vitamins-Minerals (CENTRUM ULTRA WOMENS PO) Take by mouth.   Multiple Vitamins-Minerals (PRESERVISION/LUTEIN PO) Take by mouth.   aspirin EC 81 MG tablet Take 81 mg by mouth daily. Swallow whole. (Patient not taking: Reported on 10/11/2022)   carbamide peroxide (DEBROX) 6.5 % OTIC solution Place 5 drops into the left ear 2 (two) times daily. (Patient not taking:  Reported on 10/11/2022)   chlorhexidine (PERIDEX) 0.12 % solution daily. (Patient not taking: Reported on 10/11/2022)   escitalopram (LEXAPRO) 10 MG tablet TAKE 1/2 TABLET(5 MG) BY MOUTH DAILY (Patient not taking: Reported on 10/11/2022)   No facility-administered encounter medications on file as of 10/11/2022.    Allergies (verified) Statins and Sulfa antibiotics   History: Past Medical History:  Diagnosis Date   Anxiety    Cataract    right   Diverticulosis    Hemorrhoids    Hyperlipemia    Hypothyroidism    Reflux    Past Surgical History:  Procedure Laterality Date   APPENDECTOMY     BILATERAL SALPINGOOPHORECTOMY     TOTAL ABDOMINAL HYSTERECTOMY     Family History  Problem Relation Age of Onset   Heart attack Mother    Stroke Mother    Social History   Socioeconomic History   Marital status: Married    Spouse name: Not on file   Number of children: Not on file   Years of education: Not on file   Highest education level: Not on file  Occupational History   Not on file  Tobacco Use   Smoking status: Never   Smokeless tobacco: Never  Vaping Use   Vaping Use: Never used  Substance and Sexual Activity   Alcohol use: Not Currently   Drug use: Not Currently   Sexual activity: Not on file  Other Topics Concern   Not on file  Social History Narrative   Not on file   Social Determinants of Health   Financial Resource Strain: Low Risk  (10/11/2022)   Overall Financial Resource Strain (CARDIA)    Difficulty of Paying Living Expenses: Not hard at all  Food Insecurity: No Food Insecurity (10/11/2022)   Hunger Vital Sign    Worried About Running Out of Food in the Last Year: Never true    Ran Out of Food in the Last Year: Never true  Transportation Needs: No Transportation Needs (10/11/2022)   PRAPARE - Hydrologist (Medical): No    Lack of Transportation (Non-Medical): No  Physical Activity: Sufficiently Active (10/11/2022)   Exercise  Vital Sign    Days of Exercise per Week: 7 days    Minutes of Exercise per Session: 30 min  Stress: No Stress Concern Present (10/11/2022)   [place]    Feeling of Stress : Not at all  Social Connections: Not on file    Tobacco Counseling Counseling given: Not Answered   Clinical Intake:  Pre-visit preparation completed: Yes  Pain : No/denies pain     Nutritional Status: BMI of 19-24  Normal Nutritional Risks: None Diabetes: No  How often do you need to have someone help you when you read instructions, pamphlets, or other written materials from your doctor or pharmacy?: 1 - Never  Diabetic? no  Interpreter Needed?: No  Information entered by :: NAllen LPN   Activities of Daily Living    10/11/2022   10:41 AM  In your present state of health, do you have any difficulty performing the following activities:  Hearing? 1  Vision? 1  Comment macular degeneration  Difficulty concentrating or making decisions? 0  Walking or climbing stairs? 1  Dressing or bathing? 0  Doing errands, shopping? 1  Comment does not drive  Preparing Food and eating ? N  Using the Toilet? N  In the past six months, have you accidently leaked urine? Y  Do you have problems with loss of bowel control? N  Managing your Medications? N  Managing your Finances? N  Housekeeping or managing your Housekeeping? N    Patient Care Team: Elsie Stain, MD as PCP - General (Pulmonary Disease)  Indicate any recent Medical Services you may have received from other than [facility] providers in the past year (date may be approximate).     Assessment:   This is a routine wellness examination for Ruth Porter.  Hearing/Vision screen Vision Screening - Comments:: Regular eye exams, Dr. Yolanda Bonine  Dietary issues and exercise activities discussed: Current Exercise Habits: Home exercise routine, Type of exercise: walking (tai chi), Time  (Minutes): 30, Frequency (Times/Week): 7, Weekly Exercise (Minutes/Week): 210   Goals Addressed             This Visit's Progress    Patient Stated       10/11/2022, wants to eat better       Depression Screen    10/11/2022   10:40 AM 11/07/2021    3:13 PM 08/15/2021    2:41 PM  PHQ 2/9 Scores  PHQ - 2 Score 0 0 0    Fall Risk    10/11/2022   10:40 AM 11/07/2021    3:13 PM 08/15/2021    3:19 PM 08/15/2021    2:44 PM 08/15/2021    2:41 PM  [place] in the past year? 0 0  1 0  Number falls in past yr: 0 0  1 0  Injury with Fall? 0 0  0 0  Risk for fall due to : Medication side effect No Fall Risks History of fall(s);Impaired balance/gait;Impaired vision Other (Comment) No Fall Risks  Follow up Falls prevention discussed;Education provided;Falls evaluation completed  Falls evaluation completed;Education provided;Falls prevention discussed;Follow up appointment    Comment   Physical therapy referral made      FALL RISK PREVENTION PERTAINING TO THE HOME:  Any stairs in or around the home? No  If so, are there any without handrails? N/a Home free of loose throw rugs in walkways, pet beds, electrical cords, etc? Yes  Adequate lighting in your home to reduce risk of falls? Yes   ASSISTIVE DEVICES UTILIZED TO PREVENT FALLS:  Life alert? No  Use of a cane, walker or w/c? No  Grab bars in the bathroom? Yes  Shower chair or bench in shower? No  Elevated toilet seat or a handicapped toilet? No   TIMED UP AND GO:  Was the test performed? No .      Cognitive Function:        10/11/2022   10:44 AM  6CIT Screen  What Year? 0 points  What month? 0 points  What time? 0 points  Count back from 20 0 points  Months in reverse 2 points  Repeat phrase 0 points  Total Score 2 points    Immunizations Immunization History  Administered Date(s) Administered   Influenza, High Dose Seasonal PF 05/18/2014, 05/04/2016, 05/17/2021   PFIZER(Purple Top)SARS-COV-2  Vaccination 12/29/2019   Pneumococcal Conjugate-13 06/20/2015   Pneumococcal Polysaccharide-23 05/09/2010   Td 05/05/2009   Tdap 05/05/2009   Zoster, Live 06/04/2011    TDAP status: Due, Education has been provided regarding the importance of this vaccine. Advised may receive this vaccine at local pharmacy or Health Dept. Aware to provide a copy of the vaccination record if obtained from local pharmacy or Health Dept. Verbalized acceptance and understanding.  Flu Vaccine status: Up to date  Pneumococcal vaccine status: Up to date  Covid-19 vaccine status: Completed vaccines  Qualifies for Shingles Vaccine? Yes   Zostavax completed Yes   Shingrix Completed?: No.    Education has been provided regarding the importance of this vaccine. Patient has been advised to call insurance company to determine out of pocket expense if they have not yet received this vaccine. Advised may also receive vaccine at local pharmacy or Health Dept. Verbalized acceptance and understanding.  Screening Tests Health Maintenance  Topic Date Due   Medicare Annual Wellness (AWV)  Never done   Zoster Vaccines- Shingrix (1 of 2) Never done   DTaP/Tdap/Td (3 - Td or Tdap) 05/06/2019   INFLUENZA VACCINE  02/13/2022   Pneumonia Vaccine 1+ Years old  Completed   DEXA SCAN  Completed   HPV VACCINES  Aged Out   COVID-19 Vaccine  Discontinued    Health Maintenance  Health Maintenance Due  Topic Date Due   Medicare Annual Wellness (AWV)  Never done   Zoster Vaccines- Shingrix (1 of 2) Never done   DTaP/Tdap/Td (3 - Td or Tdap) 05/06/2019   INFLUENZA VACCINE  02/13/2022    Colorectal cancer screening: No longer required.   Mammogram status: No longer required due to age.  Bone Density status: Completed 03/19/2007.   Lung Cancer Screening: (Low Dose CT Chest recommended if Age 37-80 years, 30 pack-year currently smoking OR have quit w/in 15years.) does not qualify.   Lung  Cancer Screening Referral:  no  Additional Screening:  Hepatitis C Screening: does not qualify;   Vision Screening: Recommended annual ophthalmology exams for early detection of glaucoma and other disorders of the eye. Is the patient up to date with their annual eye exam?  Yes  Who is the provider or what is the name of the office in which the patient attends annual eye exams? Can't remember name If pt is not established with a provider, would they like to be referred to a provider to establish care? No .   Dental Screening: Recommended annual dental exams for proper oral hygiene  Community Resource Referral / Chronic Care Management: CRR required this visit?  No   CCM required this visit?  No      Plan:     I have personally reviewed and noted the following in the patient's chart:   Medical and social history Use of alcohol, tobacco or illicit drugs  Current medications and supplements including opioid prescriptions. Patient is not currently taking opioid prescriptions. Functional ability and status Nutritional status Physical activity Advanced directives List of other physicians Hospitalizations, surgeries, and ER visits in previous 12 months Vitals Screenings to include cognitive, depression, and falls Referrals and appointments  In addition, I have reviewed and discussed with patient certain preventive protocols, quality metrics, and best practice recommendations. A written personalized care plan for preventive services as well as general preventive health recommendations were provided to patient.     Kellie Simmering, LPN   [id number]   Nurse Notes: none  Due to this being a virtual visit, the after visit summary with patients personalized plan was offered to patient via mail or my-chart.  to pick up at office at next visit

## 2022-10-11 NOTE — Patient Instructions (Signed)
Ruth Porter , Thank you for taking time to come for your Medicare Wellness Visit. I appreciate your ongoing commitment to your health goals. Please review the following plan we discussed and let me know if I can assist you in the future.   These are the goals we discussed:  Goals      Patient Stated     10/11/2022, wants to eat better        This is a list of the screening recommended for you and due dates:  Health Maintenance  Topic Date Due   Zoster (Shingles) Vaccine (1 of 2) Never done   DTaP/Tdap/Td vaccine (3 - Td or Tdap) 05/06/2019   Flu Shot  02/13/2022   Medicare Annual Wellness Visit  10/11/2023   Pneumonia Vaccine  Completed   DEXA scan (bone density measurement)  Completed   HPV Vaccine  Aged Out   COVID-19 Vaccine  Discontinued    Advanced directives: Advance directive discussed with you today. .  Conditions/risks identified: none  Next appointment: Follow up in one year for your annual wellness visit    Preventive Care 65 Years and Older, Female Preventive care refers to lifestyle choices and visits with your health care provider that can promote health and wellness. What does preventive care include? A yearly physical exam. This is also called an annual well check. Dental exams once or twice a year. Routine eye exams. Ask your health care provider how often you should have your eyes checked. Personal lifestyle choices, including: Daily care of your teeth and gums. Regular physical activity. Eating a healthy diet. Avoiding tobacco and drug use. Limiting alcohol use. Practicing safe sex. Taking low-dose aspirin every day. Taking vitamin and mineral supplements as recommended by your health care provider. What happens during an annual well check? The services and screenings done by your health care provider during your annual well check will depend on your age, overall health, lifestyle risk factors, and family history of disease. Counseling  Your health  care provider may ask you questions about your: Alcohol use. Tobacco use. Drug use. Emotional well-being. Home and relationship well-being. Sexual activity. Eating habits. History of falls. Memory and ability to understand (cognition). Work and work Statistician. Reproductive health. Screening  You may have the following tests or measurements: Height, weight, and BMI. Blood pressure. Lipid and cholesterol levels. These may be checked every 5 years, or more frequently if you are over 54 years old. Skin check. Lung cancer screening. You may have this screening every year starting at age 28 if you have a 30-pack-year history of smoking and currently smoke or have quit within the past 15 years. Fecal occult blood test (FOBT) of the stool. You may have this test every year starting at age 13. Flexible sigmoidoscopy or colonoscopy. You may have a sigmoidoscopy every 5 years or a colonoscopy every 10 years starting at age 41. Hepatitis C blood test. Hepatitis B blood test. Sexually transmitted disease (STD) testing. Diabetes screening. This is done by checking your blood sugar (glucose) after you have not eaten for a while (fasting). You may have this done every 1-3 years. Bone density scan. This is done to screen for osteoporosis. You may have this done starting at age 59. Mammogram. This may be done every 1-2 years. Talk to your health care provider about how often you should have regular mammograms. Talk with your health care provider about your test results, treatment options, and if necessary, the need for more tests. Vaccines  Your health care provider may recommend certain vaccines, such as: Influenza vaccine. This is recommended every year. Tetanus, diphtheria, and acellular pertussis (Tdap, Td) vaccine. You may need a Td booster every 10 years. Zoster vaccine. You may need this after age 33. Pneumococcal 13-valent conjugate (PCV13) vaccine. One dose is recommended after age  70. Pneumococcal polysaccharide (PPSV23) vaccine. One dose is recommended after age 43. Talk to your health care provider about which screenings and vaccines you need and how often you need them. This information is not intended to replace advice given to you by your health care provider. Make sure you discuss any questions you have with your health care provider. Document Released: 07/29/2015 Document Revised: 03/21/2016 Document Reviewed: 05/03/2015 Elsevier Interactive Patient Education  2017 [employer] Prevention in the Home Falls can cause injuries. They can happen to people of all ages. There are many things you can do to make your home safe and to help prevent falls. What can I do on the outside of my home? Regularly fix the edges of walkways and driveways and fix any cracks. Remove anything that might make you trip as you walk through a door, such as a raised step or threshold. Trim any bushes or trees on the path to your home. Use bright outdoor lighting. Clear any walking paths of anything that might make someone trip, such as rocks or tools. Regularly check to see if handrails are loose or broken. Make sure that both sides of any steps have handrails. Any raised decks and porches should have guardrails on the edges. Have any leaves, snow, or ice cleared regularly. Use sand or salt on walking paths during winter. Clean up any spills in your garage right away. This includes oil or grease spills. What can I do in the bathroom? Use night lights. Install grab bars by the toilet and in the tub and shower. Do not use towel bars as grab bars. Use non-skid mats or decals in the tub or shower. If you need to sit down in the shower, use a plastic, non-slip stool. Keep the floor dry. Clean up any water that spills on the floor as soon as it happens. Remove soap buildup in the tub or shower regularly. Attach bath mats securely with double-sided non-slip rug tape. Do not have throw  rugs and other things on the floor that can make you trip. What can I do in the bedroom? Use night lights. Make sure that you have a light by your bed that is easy to reach. Do not use any sheets or blankets that are too big for your bed. They should not hang down onto the floor. Have a firm chair that has side arms. You can use this for support while you get dressed. Do not have throw rugs and other things on the floor that can make you trip. What can I do in the kitchen? Clean up any spills right away. Avoid walking on wet floors. Keep items that you use a lot in easy-to-reach places. If you need to reach something above you, use a strong step stool that has a grab bar. Keep electrical cords out of the way. Do not use floor polish or wax that makes floors slippery. If you must use wax, use non-skid floor wax. Do not have throw rugs and other things on the floor that can make you trip. What can I do with my stairs? Do not leave any items on the stairs. Make sure that there are  handrails on both sides of the stairs and use them. Fix handrails that are broken or loose. Make sure that handrails are as long as the stairways. Check any carpeting to make sure that it is firmly attached to the stairs. Fix any carpet that is loose or worn. Avoid having throw rugs at the top or bottom of the stairs. If you do have throw rugs, attach them to the floor with carpet tape. Make sure that you have a light switch at the top of the stairs and the bottom of the stairs. If you do not have them, ask someone to add them for you. What else can I do to help prevent falls? Wear shoes that: Do not have high heels. Have rubber bottoms. Are comfortable and fit you well. Are closed at the toe. Do not wear sandals. If you use a stepladder: Make sure that it is fully opened. Do not climb a closed stepladder. Make sure that both sides of the stepladder are locked into place. Ask someone to hold it for you, if  possible. Clearly mark and make sure that you can see: Any grab bars or handrails. First and last steps. Where the edge of each step is. Use tools that help you move around (mobility aids) if they are needed. These include: Canes. Walkers. Scooters. Crutches. Turn on the lights when you go into a dark area. Replace any light bulbs as soon as they burn out. Set up your furniture so you have a clear path. Avoid moving your furniture around. If any of your floors are uneven, fix them. If there are any pets around you, be aware of where they are. Review your medicines with your doctor. Some medicines can make you feel dizzy. This can increase your chance of falling. Ask your doctor what other things that you can do to help prevent falls. This information is not intended to replace advice given to you by your health care provider. Make sure you discuss any questions you have with your health care provider. Document Released: 04/28/2009 Document Revised: 12/08/2015 Document Reviewed: 08/06/2014 Elsevier Interactive Patient Education  2017 [employer].

## 2022-10-14 ENCOUNTER — Other Ambulatory Visit: Payer: Self-pay | Admitting: Critical Care Medicine

## 2022-10-15 ENCOUNTER — Other Ambulatory Visit: Payer: Self-pay | Admitting: Critical Care Medicine

## 2022-10-16 NOTE — Telephone Encounter (Signed)
Duplicate request- filled 10/15/22 #30 Requested Prescriptions  Pending Prescriptions Disp Refills   levothyroxine (SYNTHROID) 50 MCG tablet [Pharmacy Med Name: LEVOTHYROXINE 0.05MG  (50MCG) TAB] 90 tablet     Sig: TAKE 1 TABLET(50 MCG) BY MOUTH DAILY     Endocrinology:  Hypothyroid Agents Failed - 10/15/2022  3:38 PM      Failed - TSH in normal range and within 360 days    TSH  Date Value Ref Range Status  05/31/2021 4.170 0.450 - 4.500 uIU/mL Final         Passed - Valid encounter within last 12 months    Recent Outpatient Visits           11 months ago Hypothyroidism, unspecified type   [place] Elsie Stain, MD   1 year ago Frequent falls   [place] Elsie Stain, MD   1 year ago Mixed hyperlipidemia   [place], MD       Future Appointments             In 2 months Fay Records, MD [place] [facility], [place]

## 2022-10-29 ENCOUNTER — Ambulatory Visit: Payer: Self-pay

## 2022-10-29 NOTE — Telephone Encounter (Signed)
  Chief Complaint: General Malaise, dizziness, doesn't feel well Symptoms: above Frequency: 2 days Pertinent Negatives: Patient denies  Disposition: [] ED /[x] Urgent Care (no appt availability in office) / [] Appointment(In office/virtual)/ []  [facility] [facility]/ [] Home Care/ [] Refused Recommended Disposition /[] [place] [facility]/ []  Follow-up with PCP Additional Notes: PT states for the past 2 days, she just doesn't feel well. Malaise, dizziness and not herself. Pt will go to UC and has follow up appt scheduled.    Summary: Dizziness, unsteady on feet   The daughter of the patient called in stating her mother has been dizzy lately and unsteady on her feet. She states she just does not feel good overall. This has been going on for the last couple of days. Please assist patient further as there are no available appts with her provider anytime soon.     Reason for Disposition  [1] MODERATE weakness (i.e., interferes with work, school, normal activities) AND [2] persists > 3 days  Answer Assessment - Initial Assessment Questions 1. DESCRIPTION: "Describe how you are feeling."     Over all malaise 2. SEVERITY: "How bad is it?"  "Can you stand and walk?"   - MILD (0-3): Feels weak or tired, but does not interfere with work, school or normal activities.   - MODERATE (4-7): Able to stand and walk; weakness interferes with work, school, or normal activities.   - SEVERE (8-10): Unable to stand or walk; unable to do usual activities.     Mild  3. ONSET: "When did these symptoms begin?" (e.g., hours, days, weeks, months)     2 days 4. CAUSE: "What do you think is causing the weakness or fatigue?" (e.g., not drinking enough fluids, medical problem, trouble sleeping)     Unsure 5. NEW MEDICINES:  "Have you started on any new medicines recently?" (e.g., opioid pain medicines, benzodiazepines, muscle relaxants, antidepressants, antihistamines, neuroleptics, beta blockers)      6. OTHER  SYMPTOMS: "Do you have any other symptoms?" (e.g., chest pain, fever, cough, SOB, vomiting, diarrhea, bleeding, other areas of pain)     no  Protocols used: Weakness (Generalized) and Fatigue-A-AH

## 2022-11-05 DIAGNOSIS — H903 Sensorineural hearing loss, bilateral: Secondary | ICD-10-CM | POA: Insufficient documentation

## 2022-11-14 ENCOUNTER — Ambulatory Visit: Payer: Medicare (Managed Care) | Admitting: Physician Assistant

## 2022-11-19 ENCOUNTER — Encounter: Payer: Self-pay | Admitting: Physician Assistant

## 2022-11-19 ENCOUNTER — Ambulatory Visit: Payer: Medicare (Managed Care) | Attending: Physician Assistant | Admitting: Physician Assistant

## 2022-11-19 VITALS — BP 120/84 | HR 80 | Wt 119.2 lb

## 2022-11-19 DIAGNOSIS — E039 Hypothyroidism, unspecified: Secondary | ICD-10-CM | POA: Diagnosis not present

## 2022-11-19 DIAGNOSIS — R5383 Other fatigue: Secondary | ICD-10-CM | POA: Diagnosis not present

## 2022-11-19 DIAGNOSIS — F411 Generalized anxiety disorder: Secondary | ICD-10-CM | POA: Diagnosis not present

## 2022-11-19 MED ORDER — ESCITALOPRAM OXALATE 5 MG PO TABS: 5.0000 mg | ORAL_TABLET | Freq: Every day | ORAL | 1 refills | Status: DC

## 2022-11-19 NOTE — Progress Notes (Signed)
Patient ID: Ruth Porter, female   DOB: 02-09-1933, 89 y.o.   MRN: [id number]   Ruth Porter, is a 89 y.o. female  [id number]  [id number]  DOB - [birth year]  Chief Complaint  Patient presents with   Dizziness       Subjective:   Ruth Porter is a 89 y.o. female here today for check on thyroid meds.  Her daughter is with her.  Patient admits to skipping meals lately and not drinking enough water just because she gets preoccupied doing other things.  She is feeling more tired than usual.  She had quit lexapro but would like to resume it bc she felt as though she has better energy when she was taking it.    About 3 weeks ago she just didn't feel well for 2-3 days.  Felt a little dizzy.  No CP/SOB.  Jest felt tired and weak but feeling ok since other than fatigue  No problems updated.  ALLERGIES: Allergies  Allergen Reactions   Statins Other (See Comments)    Vision changes   Sulfa Antibiotics Other (See Comments)    Pt allergic to sulfa but does not know reaction    PAST MEDICAL HISTORY: Past Medical History:  Diagnosis Date   Anxiety    Cataract    right   Diverticulosis    Hemorrhoids    Hyperlipemia    Hypothyroidism    Reflux     MEDICATIONS AT HOME: Prior to Admission medications   Medication Sig Start Date End Date Taking? Authorizing Provider  aspirin EC 81 MG tablet Take 81 mg by mouth daily. Swallow whole.   Yes [provider]  Cholecalciferol (VITAMIN D) 125 MCG (5000 UT) CAPS Take 5,000 Units by mouth daily.   Yes [provider]  escitalopram (LEXAPRO) 5 MG tablet Take 1 tablet (5 mg total) by mouth daily. 11/19/22  Yes Anders Simmonds, PA-C  levothyroxine (SYNTHROID) 50 MCG tablet TAKE 1 TABLET(50 MCG) BY MOUTH DAILY 10/15/22  Yes Storm Frisk, MD  Multiple Vitamins-Minerals (CENTRUM ULTRA WOMENS PO) Take by mouth.   Yes [provider]  Multiple Vitamins-Minerals (PRESERVISION/LUTEIN PO) Take by mouth.    Yes [provider]  Apoaequorin (PREVAGEN PO) Take by mouth.    [provider]  carbamide peroxide (DEBROX) 6.5 % OTIC solution Place 5 drops into the left ear 2 (two) times daily. Patient not taking: Reported on 10/11/2022 05/09/22   Theron Arista, PA-C  chlorhexidine (PERIDEX) 0.12 % solution daily. Patient not taking: Reported on 10/11/2022 06/24/21   [provider]    ROS: Neg HEENT Neg resp Neg cardiac Neg GI Neg GU Neg MS Neg psych Neg neuro  Objective:   Vitals:   11/19/22 1437  BP: 120/84  Pulse: 80  SpO2: 98%  Weight: 119 lb 3.2 oz (54.1 kg)   Exam General appearance : Awake, alert, not in any distress. Speech Clear. Not toxic looking HEENT: Atraumatic and Normocephalic Neck: Supple, no JVD. No cervical lymphadenopathy.  Chest: Good air entry bilaterally, CTAB.  No rales/rhonchi/wheezing CVS: S1 S2 regular, no murmurs.  Extremities: B/L Lower Ext shows no edema, both legs are warm to touch Neurology: Awake alert, and oriented X 3, CN II-XII intact, Non focal Skin: No Rash  Data Review No results found for: "HGBA1C"  Assessment & Plan   1. Generalized anxiety disorder Resume(can start 1/2 tab for few days then increase to 1 tab) - escitalopram (LEXAPRO) 5 MG tablet; Take 1 tablet (  5 mg total) by mouth daily.  Dispense: 90 tablet; Refill: 1  2. Hypothyroidism, unspecified type Currently on synthroid daily-will send 90 day rxs when labs are back - Thyroid Panel With TSH  3. Other fatigue Increase water intake.  Avoid skipping meals by setting a timer, etc to help with adequate energy.   - Thyroid Panel With TSH - CBC with Differential/Platelet    Return in about 6 months (around 05/22/2023) for see PCP for chronic conditions.  The patient was given clear instructions to go to ER or return to medical center if symptoms don't improve, worsen or new problems develop. The patient verbalized understanding. The patient was told  to call to get lab results if they haven't heard anything in the next week.      Georgian Co, PA-C [facility] [place], [place] [phone]   11/19/2022, 3:27 PM

## 2022-11-19 NOTE — Patient Instructions (Signed)
Drink 80 ounces water daily °

## 2022-11-20 LAB — CBC WITH DIFFERENTIAL/PLATELET
Basophils Absolute: 0 10*3/uL (ref 0.0–0.2)
Basos: 1 %
EOS (ABSOLUTE): 0.2 10*3/uL (ref 0.0–0.4)
Eos: 3 %
Hematocrit: 39.5 % (ref 34.0–46.6)
Hemoglobin: 12.8 g/dL (ref 11.1–15.9)
Immature Grans (Abs): 0 10*3/uL (ref 0.0–0.1)
Immature Granulocytes: 0 %
Lymphocytes Absolute: 1.4 10*3/uL (ref 0.7–3.1)
Lymphs: 24 %
MCH: 26.8 pg (ref 26.6–33.0)
MCHC: 32.4 g/dL (ref 31.5–35.7)
MCV: 83 fL (ref 79–97)
Monocytes Absolute: 0.4 10*3/uL (ref 0.1–0.9)
Monocytes: 8 %
Neutrophils Absolute: 3.6 10*3/uL (ref 1.4–7.0)
Neutrophils: 64 %
Platelets: 230 10*3/uL (ref 150–450)
RBC: 4.77 x10E6/uL (ref 3.77–5.28)
RDW: 14.1 % (ref 11.7–15.4)
WBC: 5.6 10*3/uL (ref 3.4–10.8)

## 2022-11-20 LAB — THYROID PANEL WITH TSH
Free Thyroxine Index: 2.4 (ref 1.2–4.9)
T3 Uptake Ratio: 31 % (ref 24–39)
T4, Total: 7.7 ug/dL (ref 4.5–12.0)
TSH: 2.43 u[IU]/mL (ref 0.450–4.500)

## 2022-11-21 ENCOUNTER — Other Ambulatory Visit: Payer: Self-pay | Admitting: Physician Assistant

## 2022-11-21 DIAGNOSIS — E039 Hypothyroidism, unspecified: Secondary | ICD-10-CM

## 2022-11-21 MED ORDER — LEVOTHYROXINE SODIUM 50 MCG PO TABS: ORAL_TABLET | ORAL | 2 refills | Status: DC

## 2023-01-11 ENCOUNTER — Ambulatory Visit: Payer: Medicare (Managed Care) | Admitting: Internal Medicine

## 2023-03-04 NOTE — Progress Notes (Unsigned)
Cardiology Office Note   Date:  03/05/2023   ID:  Ruth Porter, Ruth Porter 1932/10/04, MRN [id number]  PCP:  Storm Frisk, MD  Cardiologist:   Dietrich Pates, MD   Pt presents for f/u of CP    History of Present Illness: Ruth Porter is a 89 y.o. female with hx of CAD, CP, HL, HTN     ON 12/14/20 seen in ED for HTN and fatigue, dizziness   Labs and EKG negative   Coronary CT angio   See below   Flow limiting dz noted in Distal LCx   REcomm medical Rx, no intervention I placed her on Crestor   She did not tolerate   Developed eye complaints   I saw the pt in March 2023    Since then she has done well   She denies CP  Breathing is good  No dizziness  No palpitations   has a small dog which she walks 3x per day  Does not take her BP at home      Current Meds  Medication Sig   Apoaequorin (PREVAGEN PO) Take by mouth.   aspirin EC 81 MG tablet Take 81 mg by mouth daily. Swallow whole.   carbamide peroxide (DEBROX) 6.5 % OTIC solution Place 5 drops into the left ear 2 (two) times daily.   chlorhexidine (PERIDEX) 0.12 % solution daily.   Cholecalciferol (VITAMIN D) 125 MCG (5000 UT) CAPS Take 5,000 Units by mouth daily.   escitalopram (LEXAPRO) 5 MG tablet Take 1 tablet (5 mg total) by mouth daily.   levothyroxine (SYNTHROID) 50 MCG tablet TAKE 1 TABLET(50 MCG) BY MOUTH DAILY   Multiple Vitamins-Minerals (CENTRUM ULTRA WOMENS PO) Take by mouth.   Multiple Vitamins-Minerals (PRESERVISION/LUTEIN PO) Take by mouth.     Allergies:   Statins and Sulfa antibiotics   Past Medical History:  Diagnosis Date   Anxiety    Cataract    right   Diverticulosis    Hemorrhoids    Hyperlipemia    Hypothyroidism    Reflux     Past Surgical History:  Procedure Laterality Date   APPENDECTOMY     BILATERAL SALPINGOOPHORECTOMY     TOTAL ABDOMINAL HYSTERECTOMY       Social History:  The patient  reports that she has never smoked. She has never used smokeless tobacco. She reports  that she does not currently use alcohol. She reports that she does not currently use drugs.   Family History:  The patient's family history includes Heart attack in her mother; Stroke in her mother.    ROS:  Please see the history of present illness. All other systems are reviewed and  Negative to the above problem except as noted.    PHYSICAL EXAM: VS:  BP (!) 148/84   Pulse 65   Ht 5\' 3"  (1.6 m)   Wt 124 lb (56.2 kg)   SpO2 96%   BMI 21.97 kg/m    GEN: Well nourished, well developed, NAD   HEENT: normal  Neck: no JVD, carotid bruits  Cardiac: RRR; no murmur  No  LE edema  Respiratory:  clear to auscultation bilaterally GI: soft, nontender, nondistended, + BS  No hepatomegaly  MS: no deformity  Skin: warm and dry, no rash Neuro:  Strength and sensation are intact Psych: euthymic mood, full affect   EKG:  EKG shows SR 68 bpm   Anteroseptal MI  Q wave V1     CT coronry angiogram  IMPRESSION: 1. No acute cardiopulmonary abnormalities. 2. Peripheral interstitial reticulation and ground-glass attenuation with scattered parenchymal bands identified within both lower lobes. Findings are favored to represent sequelae of inflammation or infection. 3. 6 mm subpleural nodule within the posterolateral right base is identified. Non-contrast chest CT at 6-12 months is recommended. If the nodule is stable at time of repeat CT, then future CT at 18-24 months (from today's scan) is considered optional for low-risk patients, but is recommended for high-risk patients. This recommendation follows the consensus statement: Guidelines for Management of Incidental Pulmonary Nodules Detected on CT Images: From the Fleischner Society 2017; Radiology 2017; 284:228-243.   IMPRESSION: 1. Coronary calcium score of 147. This was 48th percentile for similar age, and same sex, and race matched control.   2. Normal coronary origin with right dominance.   3. CAD-RADS 3. Moderate stenosis- this  may be overestimated in the setting of decreased nitroglycerin administration. Consider symptom-guided anti-ischemic pharmacotherapy as well as risk factor modification per guideline directed care. Additional analysis with CT FFR will be submitted.   4. Mild dilation of the ascending aorta: 41 mm.   5. Aortic atherosclerosis.   6. Mild aortic valve calcification- calcium score 360.   7. Small PFO visualized.     FFR  01/12/21 1. Left Main: No significant functional stenosis, CT-FFR 0.94.   2. LAD: No significant functional stenosis, CT-FFR 0.86. 3. LCX: significant functional stenosis only in the distal vessel, CT-FFR. 0.79 distal vessel. 4. RCA: No significant functional stenosis, CT-FFR 0.90.   IMPRESSION: 1. CT FFR analysis shows evidence of significant functional stenosis in the distal circumflex vessel.   2. Suboptimal nitroglycerin administration may increase the chance of false positive flow limiting lesions.   Riley Lam MD   Lipid Panel    Component Value Date/Time   CHOL 133 11/20/2021 1059   TRIG 76 11/20/2021 1059   HDL 56 11/20/2021 1059   CHOLHDL 2.4 11/20/2021 1059   LDLCALC 62 11/20/2021 1059      Wt Readings from Last 3 Encounters:  03/05/23 124 lb (56.2 kg)  11/19/22 119 lb 3.2 oz (54.1 kg)  10/11/22 118 lb (53.5 kg)      ASSESSMENT AND PLAN:  1  CAD   Pt with CAD as noted above  No symptoms of angina  Follow      2   HL   Will check labs today    She did not tolerate Crestor  Husband had problems on lipitor  She is on nothing       3  Thyroid  Continue synthroid    F/U next spring    Current medicines are reviewed at length with the patient today.  The patient does not have concerns regarding medicines.  Signed, Dietrich Pates, MD  03/05/2023 4:23 PM    [facility] [address] [place], [place], [place]  [address] Phone: [phone]; Fax: [phone]

## 2023-03-05 ENCOUNTER — Ambulatory Visit: Payer: Medicare (Managed Care) | Admitting: Internal Medicine

## 2023-03-05 ENCOUNTER — Encounter: Payer: Self-pay | Admitting: Internal Medicine

## 2023-03-05 VITALS — BP 148/84 | HR 65 | Ht 63.0 in | Wt 124.0 lb

## 2023-03-05 DIAGNOSIS — Z79899 Other long term (current) drug therapy: Secondary | ICD-10-CM

## 2023-03-05 DIAGNOSIS — E785 Hyperlipidemia, unspecified: Secondary | ICD-10-CM | POA: Diagnosis not present

## 2023-03-05 DIAGNOSIS — E782 Mixed hyperlipidemia: Secondary | ICD-10-CM

## 2023-03-05 NOTE — Patient Instructions (Addendum)
Medication Instructions:   *If you need a refill on your cardiac medications before your next appointment, please call your pharmacy*   Lab Work: NMR, BMET  If you have labs (blood work) drawn today and your tests are completely normal, you will receive your results only by: MyChart Message (if you have MyChart) OR A paper copy in the mail If you have any lab test that is abnormal or we need to change your treatment, we will call you to review the results.   Testing/Procedures:    Follow-Up: At [facility], you and your health needs are our priority.  As part of our continuing mission to provide you with exceptional heart care, we have created designated Provider Care Teams.  These Care Teams include your primary Cardiologist (physician) and Advanced Practice Providers (APPs -  Physician Assistants and Nurse Practitioners) who all work together to provide you with the care you need, when you need it.  We recommend signing up for the patient portal called "MyChart".  Sign up information is provided on this After Visit Summary.  MyChart is used to connect with patients for Virtual Visits (Telemedicine).  Patients are able to view lab/test results, encounter notes, upcoming appointments, etc.  Non-urgent messages can be sent to your provider as well.   To learn more about what you can do with MyChart, go to [email or web address].    Your next appointment:   8 month(s)  Provider:   DR Dietrich Pates     Other Instructions PLEASE CHECK YOUR BP AT HOME AND LET us KNOW HOW YOU ARE DOING

## 2023-03-06 LAB — BASIC METABOLIC PANEL
BUN/Creatinine Ratio: 30 — ABNORMAL HIGH (ref 12–28)
BUN: 26 mg/dL (ref 8–27)
CO2: 24 mmol/L (ref 20–29)
Calcium: 9.9 mg/dL (ref 8.7–10.3)
Chloride: 103 mmol/L (ref 96–106)
Creatinine, Ser: 0.86 mg/dL (ref 0.57–1.00)
Glucose: 108 mg/dL — ABNORMAL HIGH (ref 70–99)
Potassium: 4.2 mmol/L (ref 3.5–5.2)
Sodium: 141 mmol/L (ref 134–144)
eGFR: 65 mL/min/{1.73_m2} (ref >59)

## 2023-03-06 LAB — NMR, LIPOPROFILE
Cholesterol, Total: 199 mg/dL (ref 100–199)
HDL Particle Number: 35.8 umol/L (ref >=30.5)
HDL-C: 57 mg/dL (ref >39)
LDL Particle Number: 1278 nmol/L — ABNORMAL HIGH (ref <1000)
LDL Size: 21.3 nm (ref >20.5)
LDL-C (NIH Calc): 115 mg/dL — ABNORMAL HIGH (ref 0–99)
LP-IR Score: 35 (ref <=45)
Small LDL Particle Number: 415 nmol/L (ref <=527)
Triglycerides: 155 mg/dL — ABNORMAL HIGH (ref 0–149)

## 2023-03-11 ENCOUNTER — Other Ambulatory Visit: Payer: Self-pay

## 2023-03-11 DIAGNOSIS — E782 Mixed hyperlipidemia: Secondary | ICD-10-CM

## 2023-03-11 DIAGNOSIS — E785 Hyperlipidemia, unspecified: Secondary | ICD-10-CM

## 2023-03-11 DIAGNOSIS — Z79899 Other long term (current) drug therapy: Secondary | ICD-10-CM

## 2023-03-11 MED ORDER — NEXLIZET 180-10 MG PO TABS: 1.0000 | ORAL_TABLET | Freq: Every day | ORAL | 3 refills | Status: DC

## 2023-03-21 ENCOUNTER — Telehealth: Payer: Self-pay | Admitting: Pharmacy Technician

## 2023-03-21 ENCOUNTER — Other Ambulatory Visit (HOSPITAL_COMMUNITY): Payer: Self-pay

## 2023-03-21 NOTE — Telephone Encounter (Signed)
Pharmacy Patient Advocate Encounter  Received notification from [employer] that Prior Authorization for nexlizet has been APPROVED from 03/12/23 to 03/11/24   PA #/Case ID/Reference #: [id number]

## 2023-05-26 NOTE — Progress Notes (Unsigned)
Established Patient Office Visit  Subjective:  Patient ID: Ruth Porter, female    DOB: 13-Mar-1933  Age: 89 y.o. MRN: [id number]  CC: Primary care follow up   HPI 07/2021 Ruth Porter presents for primary care follow-up The patient noticed over the last month she has had 2 falls without injury.  1 when she slipped in the mud the other when she was walking her dog.  She lives in an independent apartment not in assisted living.  She is having trouble organizing her papers and letters and mail.  She denies any memory deficits.  She has not had physical therapy previously.  She is up-to-date on all her vaccines.  We did receive all her records from [facility] at the last visit.  She has had all her pneumonia vaccines and flu vaccines.  She declines to receive further tetanus vaccines at this visit.  She is interested in finishing her  shingles shot series.  She had an initial injection in 2012 and has not had a follow-up injection for shingles she has upcoming appointments with cardiology in March.  She also has follow-up with her eye doctor upcoming.  Patient's cholesterol is mildly elevated she is treating this with diet alone.  She is tolerating Lexapro at 5 mg daily without difficulty.  Also maintain Synthroid 50 mcg daily thyroid function was normal at the last visit.   11/07/2021 Patient seen in return follow-up and since the last visit she has undergone physical therapy and doing well.  She has had no further falls.  She had some numbness in the left leg when she slept on her leg and on fashion after going to bed late and sleeping in the next day.  The numbness is gone.  On arrival blood pressure 139/83.  She needs to have her lipids rechecked.  She has no other complaints at this time.  She is on the Lexapro 5 mg daily.  She is taking multivitamins and simvastatin.  The patient has joined the [employer] and plans to continue with this program  05/30/23 This patient is  seen in return follow-up has not been seen since 2023 by myself however did see PA Mcclung in May of this year and patient's Lexapro was resumed she continue with the Synthroid TSH was normal  Biggest complaint is she has moved into a different apartment and her belongings have yet to be unpacked and this is causing stress she is also had increasing loss of balance but she has not had any falls.  She cannot get to the gym because of transportation barriers.  She can no longer drive.  She is having treatment for macular degeneration with retinal injections.  She agrees to receive the flu vaccine at this visit. Past Medical History:  Diagnosis Date   Anxiety    Cataract    right   Diverticulosis    Hemorrhoids    Hyperlipemia    Hypothyroidism    Reflux     Past Surgical History:  Procedure Laterality Date   APPENDECTOMY     BILATERAL SALPINGOOPHORECTOMY     TOTAL ABDOMINAL HYSTERECTOMY      Family History  Problem Relation Age of Onset   Heart attack Mother    Stroke Mother     Social History   Socioeconomic History   Marital status: Married    Spouse name: Not on file   Number of children: Not on file   Years of education: Not on file  Highest education level: Not on file  Occupational History   Not on file  Tobacco Use   Smoking status: Never   Smokeless tobacco: Never  Vaping Use   Vaping status: Never Used  Substance and Sexual Activity   Alcohol use: Not Currently   Drug use: Not Currently   Sexual activity: Not on file  Other Topics Concern   Not on file  Social History Narrative   Not on file   Social Determinants of Health   Financial Resource Strain: Low Risk  (10/11/2022)   Overall Financial Resource Strain (CARDIA)    Difficulty of Paying Living Expenses: Not hard at all  Food Insecurity: Low Risk  (11/05/2022)   [facility]   Hunger Vital Sign    Worried About Running Out of Food in the Last Year: Never true    Ran  Out of Food in the Last Year: Never true  Transportation Needs: No Transportation Needs (11/05/2022)   [facility]   Transportation    In the past 12 months, has lack of reliable transportation kept you from medical appointments, meetings, work or from getting things needed for daily living? : No  Physical Activity: Sufficiently Active (10/11/2022)   Exercise Vital Sign    Days of Exercise per Week: 7 days    Minutes of Exercise per Session: 30 min  Stress: No Stress Concern Present (10/11/2022)   [employer] of Occupational Health - Occupational Stress Questionnaire    Feeling of Stress : Not at all  Social Connections: Not on file  Intimate Partner Violence: Not on file    Outpatient Medications Prior to Visit  Medication Sig Dispense Refill   Apoaequorin (PREVAGEN PO) Take by mouth.     aspirin EC 81 MG tablet Take 81 mg by mouth daily. Swallow whole.     Cholecalciferol (VITAMIN D) 125 MCG (5000 UT) CAPS Take 5,000 Units by mouth daily.     escitalopram (LEXAPRO) 5 MG tablet Take 1 tablet (5 mg total) by mouth daily. 90 tablet 1   levothyroxine (SYNTHROID) 50 MCG tablet TAKE 1 TABLET(50 MCG) BY MOUTH DAILY 90 tablet 2   Multiple Vitamins-Minerals (CENTRUM ULTRA WOMENS PO) Take by mouth.     Multiple Vitamins-Minerals (PRESERVISION/LUTEIN PO) Take by mouth.     Bempedoic Acid-Ezetimibe (NEXLIZET) 180-10 MG TABS Take 1 tablet by mouth daily. 90 tablet 3   carbamide peroxide (DEBROX) 6.5 % OTIC solution Place 5 drops into the left ear 2 (two) times daily. 15 mL 0   chlorhexidine (PERIDEX) 0.12 % solution daily.     No facility-administered medications prior to visit.    Allergies  Allergen Reactions   Statins Other (See Comments)    Vision changes   Sulfa Antibiotics Other (See Comments)    Pt allergic to sulfa but does not know reaction    ROS Review of Systems  Constitutional:  Negative for chills, diaphoresis and fever.  HENT:  Negative  for congestion, hearing loss, nosebleeds, sore throat and tinnitus.   Eyes:  Negative for photophobia and redness.  Respiratory:  Negative for cough, shortness of breath, wheezing and stridor.   Cardiovascular:  Negative for chest pain, palpitations and leg swelling.  Gastrointestinal:  Negative for abdominal pain, blood in stool, constipation, diarrhea, nausea and vomiting.  Endocrine: Negative for polydipsia.  Genitourinary:  Negative for dysuria, flank pain, frequency, hematuria and urgency.  Musculoskeletal:  Negative for back pain, myalgias and neck pain.  Skin:  Negative for rash.  Allergic/Immunologic: Negative for environmental allergies.  Neurological:  Negative for dizziness, tremors, seizures, weakness and headaches.       Balance issues but no recent falls  Hematological:  Does not bruise/bleed easily.  Psychiatric/Behavioral:  Negative for suicidal ideas. The patient is not nervous/anxious.       Objective:    Physical Exam Vitals reviewed.  Constitutional:      Appearance: Normal appearance. She is well-developed. She is not diaphoretic.  HENT:     Head: Normocephalic and atraumatic.     Nose: No nasal deformity, septal deviation, mucosal edema or rhinorrhea.     Right Sinus: No maxillary sinus tenderness or frontal sinus tenderness.     Left Sinus: No maxillary sinus tenderness or frontal sinus tenderness.     Mouth/Throat:     Pharynx: No oropharyngeal exudate.  Eyes:     General: No scleral icterus.    Conjunctiva/sclera: Conjunctivae normal.     Pupils: Pupils are equal, round, and reactive to light.  Neck:     Thyroid: No thyromegaly.     Vascular: No carotid bruit or JVD.     Trachea: Trachea normal. No tracheal tenderness or tracheal deviation.  Cardiovascular:     Rate and Rhythm: Normal rate and regular rhythm.     Chest Wall: PMI is not displaced.     Pulses: Normal pulses. No decreased pulses.     Heart sounds: Normal heart sounds, S1 normal and S2  normal. Heart sounds not distant. No murmur heard.    No systolic murmur is present.     No diastolic murmur is present.     No friction rub. No gallop. No S3 or S4 sounds.  Pulmonary:     Effort: No tachypnea, accessory muscle usage or respiratory distress.     Breath sounds: No stridor. No decreased breath sounds, wheezing, rhonchi or rales.  Chest:     Chest wall: No tenderness.  Abdominal:     General: Bowel sounds are normal. There is no distension.     Palpations: Abdomen is soft. Abdomen is not rigid.     Tenderness: There is no abdominal tenderness. There is no guarding or rebound.  Musculoskeletal:        General: Normal range of motion.     Cervical back: Normal range of motion and neck supple. No edema, erythema or rigidity. No muscular tenderness. Normal range of motion.  Lymphadenopathy:     Head:     Right side of head: No submental or submandibular adenopathy.     Left side of head: No submental or submandibular adenopathy.     Cervical: No cervical adenopathy.  Skin:    General: Skin is warm and dry.     Coloration: Skin is not pale.     Findings: No rash.     Nails: There is no clubbing.  Neurological:     General: No focal deficit present.     Mental Status: She is alert and oriented to person, place, and time. Mental status is at baseline.     Cranial Nerves: No cranial nerve deficit.     Sensory: No sensory deficit.     Motor: No weakness.     Coordination: Coordination abnormal.     Gait: Gait abnormal.     Deep Tendon Reflexes: Reflexes normal.  Psychiatric:        Speech: Speech normal.        Behavior: Behavior normal.  BP 113/77 (BP Location: Left Arm, Patient Position: Sitting, Cuff Size: Normal)   Pulse 71   Wt 124 lb 6.4 oz (56.4 kg)   SpO2 96%   BMI 22.04 kg/m  Wt Readings from Last 3 Encounters:  05/30/23 124 lb 6.4 oz (56.4 kg)  03/05/23 124 lb (56.2 kg)  11/19/22 119 lb 3.2 oz (54.1 kg)     Health Maintenance Due  Topic Date  Due   Zoster Vaccines- Shingrix (1 of 2) 10/07/1951   DTaP/Tdap/Td (3 - Td or Tdap) 05/06/2019    There are no preventive care reminders to display for this patient.  Lab Results  Component Value Date   TSH 2.430 11/19/2022   Lab Results  Component Value Date   WBC 5.6 11/19/2022   HGB 12.8 11/19/2022   HCT 39.5 11/19/2022   MCV 83 11/19/2022   PLT 230 11/19/2022   Lab Results  Component Value Date   NA 141 03/05/2023   K 4.2 03/05/2023   CO2 24 03/05/2023   GLUCOSE 108 (H) 03/05/2023   BUN 26 03/05/2023   CREATININE 0.86 03/05/2023   BILITOT 0.5 11/20/2021   ALKPHOS 100 11/20/2021   AST 26 11/20/2021   ALT 16 11/20/2021   PROT 6.5 11/20/2021   ALBUMIN 4.3 11/20/2021   CALCIUM 9.9 03/05/2023   ANIONGAP 8 12/14/2020   EGFR 65 03/05/2023   Lab Results  Component Value Date   CHOL 133 11/20/2021   Lab Results  Component Value Date   HDL 56 11/20/2021   Lab Results  Component Value Date   LDLCALC 62 11/20/2021   Lab Results  Component Value Date   TRIG 76 11/20/2021   Lab Results  Component Value Date   CHOLHDL 2.4 11/20/2021   No results found for: "HGBA1C"    Assessment & Plan:   Problem List Items Addressed This Visit       Endocrine   Hypothyroidism    Hypothyroidism currently stable continue with Synthroid        Other   Mixed hyperlipidemia    Intolerant of statins will monitor      Generalized anxiety disorder    Improved with Lexapro continue same      Frequent falls    Currently not falling but still has imbalance we will see if and get transportation to get her back to the fitness center      Other Visit Diagnoses     Need for immunization against influenza    -  Primary   Relevant Orders   Flu Vaccine Trivalent High Dose (Fluad) (Completed)       No orders of the defined types were placed in this encounter. Patient is interested in receiving her completion shingles vaccine series therefore a prescription printed and  given to her to obtain this at her [facility] pharmacy which will complete her series as the first injection was given in 2012  Follow-up: Return in about 6 months (around 11/27/2023) for primary care follow up.    Shan Levans, MD

## 2023-05-28 ENCOUNTER — Ambulatory Visit: Payer: Medicare (Managed Care) | Admitting: Critical Care Medicine

## 2023-05-29 ENCOUNTER — Other Ambulatory Visit: Payer: Self-pay | Admitting: Physician Assistant

## 2023-05-29 DIAGNOSIS — F411 Generalized anxiety disorder: Secondary | ICD-10-CM

## 2023-05-30 ENCOUNTER — Encounter: Payer: Self-pay | Admitting: Critical Care Medicine

## 2023-05-30 ENCOUNTER — Ambulatory Visit: Payer: Medicare (Managed Care) | Attending: Critical Care Medicine | Admitting: Critical Care Medicine

## 2023-05-30 ENCOUNTER — Telehealth: Payer: Self-pay

## 2023-05-30 VITALS — BP 113/77 | HR 71 | Wt 124.4 lb

## 2023-05-30 DIAGNOSIS — R296 Repeated falls: Secondary | ICD-10-CM | POA: Diagnosis not present

## 2023-05-30 DIAGNOSIS — F411 Generalized anxiety disorder: Secondary | ICD-10-CM

## 2023-05-30 DIAGNOSIS — E782 Mixed hyperlipidemia: Secondary | ICD-10-CM

## 2023-05-30 DIAGNOSIS — E039 Hypothyroidism, unspecified: Secondary | ICD-10-CM | POA: Diagnosis not present

## 2023-05-30 DIAGNOSIS — Z23 Encounter for immunization: Secondary | ICD-10-CM

## 2023-05-30 NOTE — Assessment & Plan Note (Signed)
Improved with Lexapro continue same

## 2023-05-30 NOTE — Assessment & Plan Note (Signed)
Hypothyroidism currently stable continue with Synthroid

## 2023-05-30 NOTE — Telephone Encounter (Signed)
At the request of Dr Delford Field, I met with the patient when she was in the clinic today.  She explained that she likes to exercise at [facility] and feels that the exercise is essential to her health and well being.  A friend had been taking her but the friend stopped going and [employer] is expensive.  She is interested in [employer] so  I helped her complete an application while she was in the clinic.  She said her primary disability is her macular degeneration as well as her advanced age.   I told her that I can also refer her to [employer] to see if she would be eligible for any additional exercise, transportation benefits through her insurance.  She was in agreement and the referral was placed,

## 2023-05-30 NOTE — Assessment & Plan Note (Signed)
Intolerant of statins will monitor

## 2023-05-30 NOTE — Patient Instructions (Signed)
We will see if can get to the scat bus for transportation to the fitness center  Refills on all medications sent to your pharmacy  Flu vaccine was given  No labs are needed today you had recent labs and they were all normal  Your thyroid function is normal no change in dosing of the thyroid medication  Hopefully if you get back into the workout program at [facility] well your balance will improve  We need to figure out a way to get assistance with unpacking your belongings in your new apartment  Return for primary care follow-up in 6 months

## 2023-05-30 NOTE — Assessment & Plan Note (Signed)
Currently not falling but still has imbalance we will see if and get transportation to get her back to the fitness center

## 2023-06-03 ENCOUNTER — Telehealth: Payer: Self-pay

## 2023-06-03 ENCOUNTER — Telehealth: Payer: Self-pay | Admitting: *Deleted

## 2023-06-03 ENCOUNTER — Other Ambulatory Visit: Payer: Self-pay | Admitting: Physician Assistant

## 2023-06-03 DIAGNOSIS — F411 Generalized anxiety disorder: Secondary | ICD-10-CM

## 2023-06-03 MED ORDER — ESCITALOPRAM OXALATE 5 MG PO TABS: 5.0000 mg | ORAL_TABLET | Freq: Every day | ORAL | 1 refills | Status: DC

## 2023-06-03 NOTE — Progress Notes (Signed)
  Care Coordination   Note   06/03/2023 Name: TOVAH LOUGHRIDGE MRN: [id number] DOB: July 15, 1933  MAYS KLUGER is a 89 y.o. year old female who sees Storm Frisk, MD for primary care. I reached out to Almira Bar by phone today to offer care coordination services.  Ms. Sprouse was given information about Care Coordination services today including:   The Care Coordination services include support from the care team which includes your Nurse Coordinator, Clinical Social Worker, or Pharmacist.  The Care Coordination team is here to help remove barriers to the health concerns and goals most important to you. Care Coordination services are voluntary, and the patient may decline or stop services at any time by request to their care team member.   Care Coordination Consent Status: Patient agreed to services and verbal consent obtained.   Follow up plan:  Telephone appointment with care coordination team member scheduled for:  06/14/23  Encounter Outcome:  Patient Scheduled  [facility] Coordination Care Guide  Direct Dial: [phone]

## 2023-06-03 NOTE — Telephone Encounter (Signed)
Cari- I received this message from [facility]:  The patient states she  needs refill on escitalopram (LEXAPRO) 5 MG tablet she said pharmacy told her she needs new RX.   It can be sent to : [id number] - [place], [place] - [address] AT [facility] OF [address]   Thanks

## 2023-06-04 ENCOUNTER — Telehealth: Payer: Self-pay | Admitting: *Deleted

## 2023-06-04 NOTE — Telephone Encounter (Signed)
Completed [employer] application emailed to [employer], [employer] Eligibility.

## 2023-06-04 NOTE — Telephone Encounter (Signed)
   Telephone encounter was:  Unsuccessful.  06/04/2023 Name: Ruth Porter MRN: [id number] DOB: August 07, 1932  Unsuccessful outbound call made today to assist with:  Transportation Needs   Outreach Attempt:  1st Attempt  A HIPAA compliant voice message was left requesting a return call.  Instructed patient to call back at [phone].  Dione Booze [facility]  Population Health Careguide  Direct Dial: [phone] Website: [email or web address]

## 2023-06-11 ENCOUNTER — Telehealth: Payer: Self-pay | Admitting: *Deleted

## 2023-06-11 NOTE — Telephone Encounter (Signed)
   Telephone encounter was:  Unsuccessful.  06/11/2023 Name: DEYLIN IVENS MRN: [id number] DOB: [birth year]  Unsuccessful outbound call made today to assist with:  Transportation Needs   Outreach Attempt:  2nd Attempt  A HIPAA compliant voice message was left requesting a return call.  Instructed patient to call back at [phone].  Dione Booze [facility]  Population Health Careguide  Direct Dial: [phone] Website: [email or web address]

## 2023-06-11 NOTE — Telephone Encounter (Signed)
   Telephone encounter was:  Successful.  06/11/2023 Name: Ruth Porter MRN: [id number] DOB: 1933/06/10  Ruth Porter is a 89 y.o. year old female who is a primary care patient of Storm Frisk, MD . The community resource team was consulted for assistance with Transportation Needs   Care guide performed the following interventions: Patient provided with information about care guide support team and interviewed to confirm resource needs. Patients daughter provided community options for transportation for patient , daughter was very grateful Follow Up Plan:  No further follow up planned at this time. The patient has been provided with needed resources.  Dione Booze [facility]  Population Health Careguide  Direct Dial: [phone] Website: [email or web address]

## 2023-06-14 ENCOUNTER — Ambulatory Visit: Payer: Self-pay | Admitting: Licensed Clinical Social Worker

## 2023-06-14 NOTE — Patient Outreach (Signed)
  Care Coordination   Initial Visit Note   06/14/2023 Name: Ruth Porter MRN: [id number] DOB: [birth year]  Ruth Porter is a 89 y.o. year old female who sees Storm Frisk, MD for primary care. I spoke with  Almira Bar by phone today.  What matters to the patients health and wellness today?  Transportation to [employer] for exercise    Goals Addressed             This Visit's Progress    Care Coordination Activities       Care Coordination Interventions: Patient stated that likes to go the [employer] at [facility] and was going with a friend and that was her transportation, now that her friend does not go anymore, she does not have a way and she does not drive anymore due to some health reasons. The SW discussed with the patient about her insurance and how [employer] may be a good source. SW researched the resources and provided the information.  SW completed the other SDOH questions and now other needs at this time SW will follow up with the patient in about 2 weeks.         SDOH assessments and interventions completed:  Yes  SDOH Interventions Today    Flowsheet Row Most Recent Value  SDOH Interventions   Food Insecurity Interventions Intervention Not Indicated  Housing Interventions Intervention Not Indicated  Transportation Interventions Other (Comment)  [Patient is needing transportation to [employer]]  Utilities Interventions Intervention Not Indicated        Care Coordination Interventions:  Yes, provided  Interventions Today    Flowsheet Row Most Recent Value  General Interventions   General Interventions Discussed/Reviewed General Interventions Discussed  [Patient is needing transportation to [employer] exercise.]        Follow up plan: Follow up call scheduled for 07/01/2023 at 10:00 am    Encounter Outcome:  Patient Visit Completed   Jeanie Cooks, PhD [facility], [facility] Social Worker Direct Dial: [phone]  Fax: [phone]

## 2023-06-14 NOTE — Patient Instructions (Signed)
Visit Information  Thank you for taking time to visit with me today. Please don't hesitate to contact me if I can be of assistance to you.   Following are the goals we discussed today:   Goals Addressed             This Visit's Progress    Care Coordination Activities       Care Coordination Interventions: Patient stated that likes to go the [employer] [facility] at [facility] and was going with a friend and that was her transportation, now that her friend does not go anymore, she does not have a way and she does not drive anymore due to some health reasons. The SW discussed with the patient about her insurance and how [employer] may be a good source. SW researched the resources and provided the information.  SW completed the other SDOH questions and now other needs at this time SW will follow up with the patient in about 2 weeks.         Our next appointment is by telephone on 07/01/2023 at 10:00 am  Please call the care guide team at [phone] if you need to cancel or reschedule your appointment.   If you are experiencing a Mental Health or Behavioral Health Crisis or need someone to talk to, please call the Suicide and Crisis Lifeline: 988 go to [facility] Urgent [facility] [address] [place] [phone]) call 911  Patient verbalizes understanding of instructions and care plan provided today and agrees to view in MyChart. Active MyChart status and patient understanding of how to access instructions and care plan via MyChart confirmed with patient.      Jeanie Cooks, PhD [facility], [facility] Social Worker Direct Dial: [phone]  Fax: [phone]

## 2023-06-28 ENCOUNTER — Telehealth: Payer: Self-pay

## 2023-06-28 NOTE — Telephone Encounter (Signed)
Copied from CRM [id number]. Topic: General - Other >> Jun 27, 2023  3:53 PM Santiya F wrote: Reason for CRM: Pt's daughter is calling in wanting to know if it was a good idea for pt to get the RSV vaccine due to her being elderly and RSV is going around. Please follow up with [name].

## 2023-07-01 ENCOUNTER — Ambulatory Visit: Payer: Self-pay | Admitting: Licensed Clinical Social Worker

## 2023-07-01 ENCOUNTER — Other Ambulatory Visit: Payer: Self-pay

## 2023-07-01 MED ORDER — RSVPREF3 VAC RECOMB ADJUVANTED 120 MCG/0.5ML IM SUSR
0.5000 mL | Freq: Once | INTRAMUSCULAR | 0 refills | Status: AC
  Filled 2023-07-01: qty 1, 1d supply, fill #0

## 2023-07-01 NOTE — Telephone Encounter (Signed)
Calling again   Please call [name] regarding the RSV vaccine

## 2023-07-01 NOTE — Patient Instructions (Signed)
Visit Information  Thank you for taking time to visit with me today. Please don't hesitate to contact me if I can be of assistance to you.   Following are the goals we discussed today:   Goals Addressed             This Visit's Progress    Care Coordination Activities       Care Coordination Interventions: Patient Patient is waiting for [employer] to respons back, Patient already received notification from insurance about transportation coverage, patient had te app on her phone and accidentally deleted it and will call insurance company to get access to the app again   SW completed the other SDOH questions and now other needs at this time SW will follow up on 07/15/2023 at 11:30 am        Our next appointment is by telephone on 07/15/2023 at 11:30 am  Please call the care guide team at [phone] if you need to cancel or reschedule your appointment.   If you are experiencing a Mental Health or Behavioral Health Crisis or need someone to talk to, please call the Suicide and Crisis Lifeline: 988 go to [facility] Urgent [facility] [address], [place] [phone]) call 911  Patient verbalizes understanding of instructions and care plan provided today and agrees to view in MyChart. Active MyChart status and patient understanding of how to access instructions and care plan via MyChart confirmed with patient.     Jeanie Cooks, PhD [facility], [facility] Social Worker Direct Dial: [phone]  Fax: [phone]

## 2023-07-01 NOTE — Patient Outreach (Signed)
  Care Coordination   Follow Up Visit Note   07/01/2023 Name: Ruth Porter MRN: [id number] DOB: [birth year]  Ruth Porter is a 89 y.o. year old female who sees Ruth Frisk, MD for primary care. I spoke with  Ruth Porter daughter Ruth Porter by phone today. SW received phone call back from previous message left earlier today.   What matters to the patients health and wellness today?  Transportation form [employer]    Goals Addressed             This Visit's Progress    Care Coordination Activities       Care Coordination Interventions: Patient Patient is waiting for [employer] to respons back, Patient already received notification from insurance about transportation coverage, patient had te app on her phone and accidentally deleted it and will call insurance company to get access to the app again   SW completed the other SDOH questions and now other needs at this time SW will follow up on 07/15/2023 at 11:30 am        SDOH assessments and interventions completed:  Yes  SDOH Interventions Today    Flowsheet Row Most Recent Value  SDOH Interventions   Transportation Interventions Other (Comment)  [Waiting to here confirmation for transportation]        Care Coordination Interventions:  Yes, provided  Interventions Today    Flowsheet Row Most Recent Value  General Interventions   General Interventions Discussed/Reviewed General Interventions Reviewed, [employer] is waiting for [employer] to respons back, Patient already received notification from insurance about transportation coverage]        Follow up plan: Follow up call scheduled for 07/15/2023 at 11:30 am    Encounter Outcome:  Patient Visit Completed   Jeanie Cooks, PhD [facility], [facility] Social Worker Direct Dial: [phone]  Fax: [phone]

## 2023-07-01 NOTE — Patient Outreach (Signed)
  Care Coordination   07/01/2023 Name: Ruth Porter MRN: [id number] DOB: 1933/01/26   Care Coordination Outreach Attempts:  An unsuccessful telephone outreach was attempted for a scheduled appointment today.  Follow Up Plan:  Additional outreach attempts will be made to offer the patient care coordination information and services.   Encounter Outcome:  No Answer   Care Coordination Interventions:  No, not indicated    Jeanie Cooks, PhD [facility], [facility] Social Worker Direct Dial: [phone]  Fax: [phone]

## 2023-07-01 NOTE — Telephone Encounter (Signed)
Spoke with patient daughter [name] . Advised that CDC recommends that adults ages 77 and older get an RSV vaccine. Advised that this vaccine is offer at [facility] and it is offered at other pharmacy like [facility] and [facility]

## 2023-07-15 ENCOUNTER — Ambulatory Visit: Payer: Self-pay | Admitting: Licensed Clinical Social Worker

## 2023-07-15 NOTE — Patient Outreach (Signed)
  Care Coordination   Follow Up Visit Note   07/15/2023 Name: Ruth Porter MRN: [id number] DOB: 01-30-1933  Ruth Porter is a 89 y.o. year old female who sees Storm Frisk, MD for primary care. I spoke with  Almira Bar by phone today.  What matters to the patients health and wellness today?  Transportation     Goals Addressed             This Visit's Progress    COMPLETED: Care Coordination Activities       Care Coordination Interventions: Patient Patient is waiting for [employer] to respons back, Patient already received notification from insurance about transportation coverage, patient had te app on her phone and accidentally deleted it and will call insurance company to get access to the app again   SW completed the other SDOH questions and now other needs at this time SW will follow up on 07/15/2023 at 11:30 am        SDOH assessments and interventions completed:  Yes  SDOH Interventions Today    Flowsheet Row Most Recent Value  SDOH Interventions   Food Insecurity Interventions Intervention Not Indicated  Housing Interventions Intervention Not Indicated  Transportation Interventions Intervention Not Indicated  Utilities Interventions Intervention Not Indicated        Care Coordination Interventions:  Yes, provided  Interventions Today    Flowsheet Row Most Recent Value  General Interventions   General Interventions Discussed/Reviewed General Interventions Reviewed, Community Resources  [[place] was able to get set up with [employer]]        Follow up plan: No further intervention required.   Encounter Outcome:  Patient Visit Completed   Jeanie Cooks, PhD [facility], [facility] Social Worker Direct Dial: [phone]  Fax: [phone]

## 2023-07-15 NOTE — Patient Instructions (Signed)
Visit Information  Thank you for taking time to visit with me today. Please don't hesitate to contact me if I can be of assistance to you.   Following are the goals we discussed today:   Goals Addressed             This Visit's Progress    COMPLETED: Care Coordination Activities       Care Coordination Interventions: Patient Patient is waiting for [employer] to respons back, Patient already received notification from insurance about transportation coverage, patient had te app on her phone and accidentally deleted it and will call insurance company to get access to the app again   SW completed the other SDOH questions and now other needs at this time SW will follow up on 07/15/2023 at 11:30 am        No further follow up needed, Sw encouraged patient to inform PCP if any future SW needs are needed  Please call the care guide team at [phone] if you need to cancel or reschedule your appointment.   If you are experiencing a Mental Health or Behavioral Health Crisis or need someone to talk to, please call the Suicide and Crisis Lifeline: 988 go to [facility] [address], [place] [phone]) call 911  Patient verbalizes understanding of instructions and care plan provided today and agrees to view in MyChart. Active MyChart status and patient understanding of how to access instructions and care plan via MyChart confirmed with patient.     Jeanie Cooks, PhD [facility], [facility] Social Worker Direct Dial: [phone]  Fax: [phone]

## 2023-08-09 IMAGING — DX DG WRIST COMPLETE 3+V*L*
4 series · 4 of 4 positions shown · non-contrast
Comparison: None.

CLINICAL DATA: Fall, left wrist pain

EXAM:
LEFT WRIST - COMPLETE 3+ VIEW

[wrist pa]
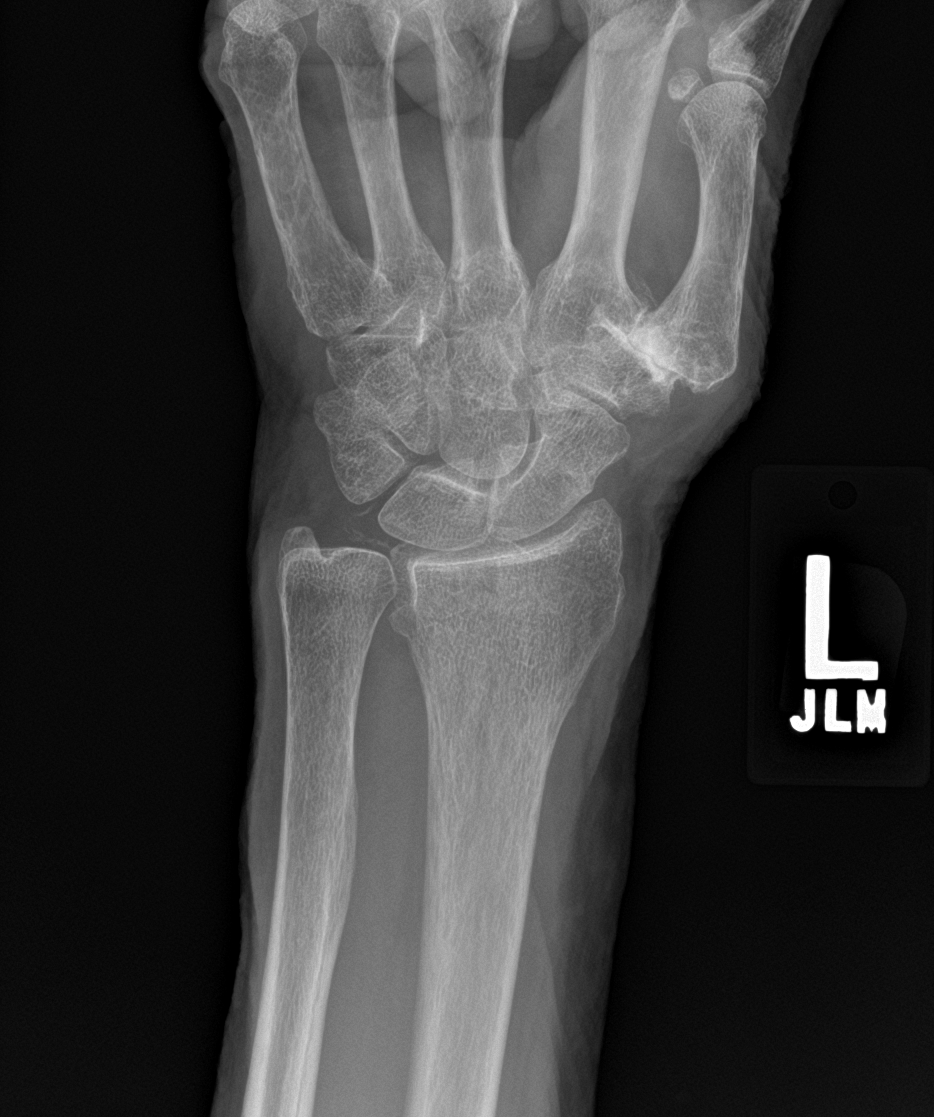

[wrist navicular]
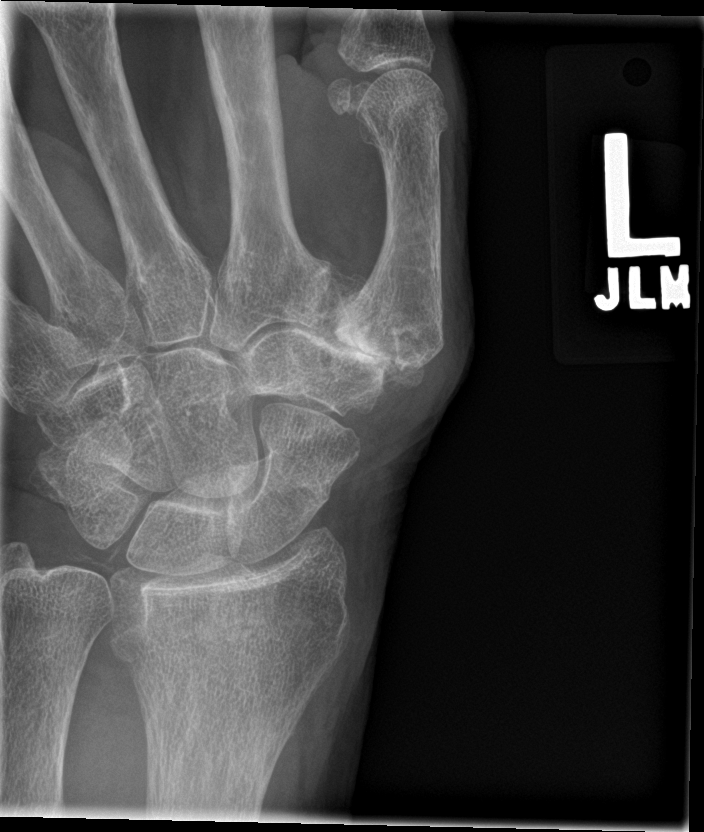

[wrist obl]
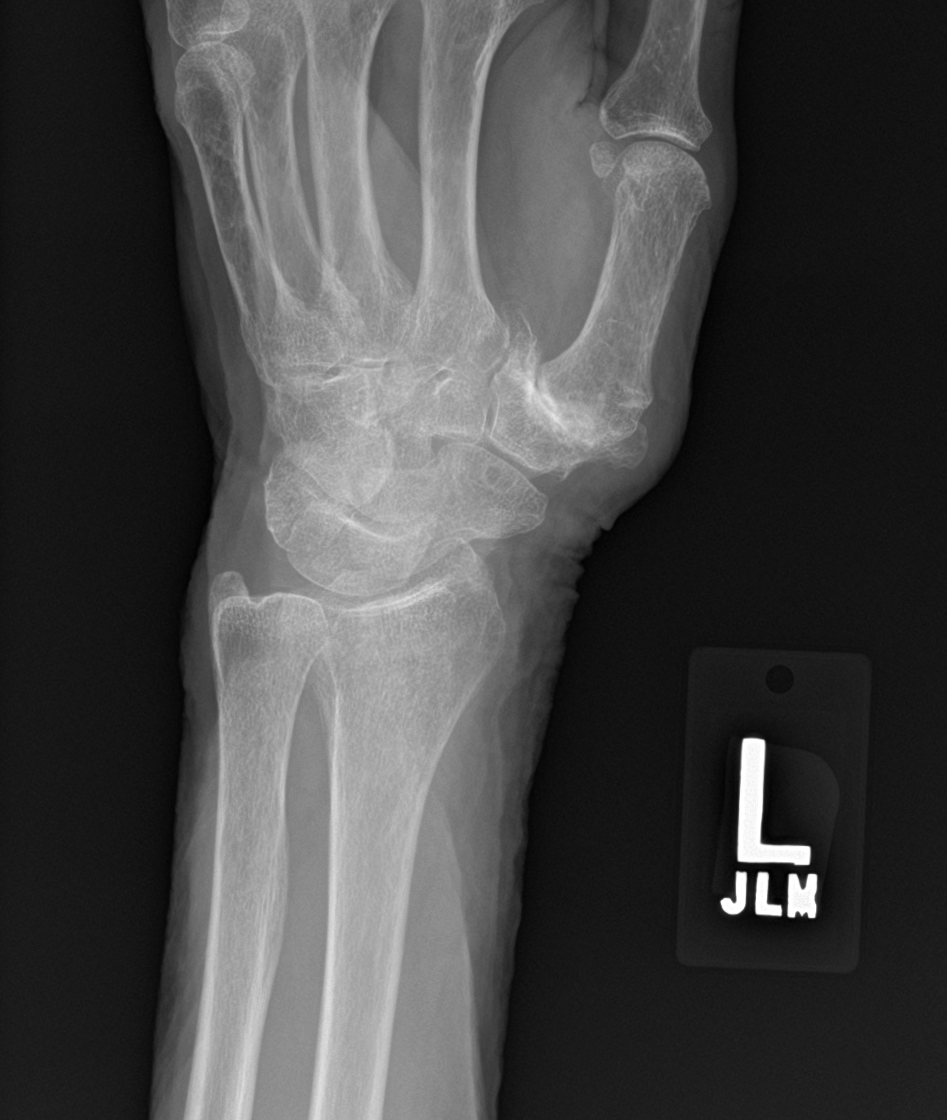

[wrist lat]
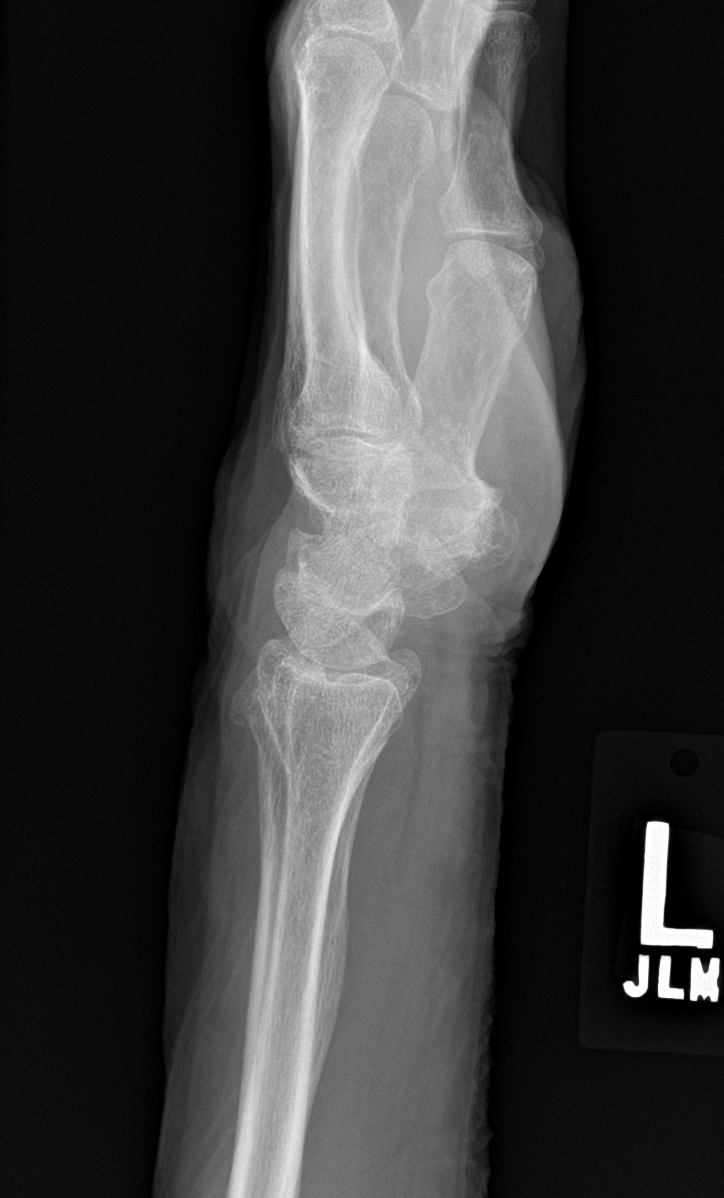

[4 of 4 positions shown; findings below may reference images not displayed]

FINDINGS: Mildly irregular appearance of the distal radial metaphysis,
possibly reflecting a nondisplaced transverse fracture. However,
this also could be related to a chronic/prior deformity.

Mild degenerative changes of the radiocarpal articulation with
chondrocalcinosis at the TFCC.

Moderate degenerative changes of the 1st CMC joint.

Mild dorsal soft tissue swelling overlying the wrist.
IMPRESSION: Mildly irregular appearance of the distal radial metaphysis,
possibly reflecting a nondisplaced transverse fracture, although age
indeterminate (possibly chronic). Correlate for point tenderness to
assess for acute injury in this location.

## 2023-08-16 ENCOUNTER — Telehealth: Payer: Self-pay

## 2023-08-16 NOTE — Telephone Encounter (Signed)
VM was left informing patient that Dr.Wright has retired and she can choose which provider she would like to transfer care to.        Copied from CRM [id number]. Topic: Clinical - Medical Advice >> Aug 16, 2023 10:01 AM Phill Myron wrote: Reason for CRM: PT. Daughter calling and asking if Dr Delford Field can speak with her mother in regards to transfer of care. What provider does he want her to see when he retires. Please advise

## 2023-09-04 ENCOUNTER — Other Ambulatory Visit: Payer: Self-pay | Admitting: Physician Assistant

## 2023-09-04 DIAGNOSIS — E039 Hypothyroidism, unspecified: Secondary | ICD-10-CM

## 2023-09-05 ENCOUNTER — Other Ambulatory Visit: Payer: Self-pay | Admitting: Family Medicine

## 2023-09-05 DIAGNOSIS — E039 Hypothyroidism, unspecified: Secondary | ICD-10-CM

## 2023-09-24 NOTE — Telephone Encounter (Signed)
 Message received from [facility] stating patient has been certified for [employer]

## 2023-10-15 ENCOUNTER — Ambulatory Visit: Payer: Medicare (Managed Care) | Attending: Critical Care Medicine

## 2023-10-28 NOTE — Progress Notes (Signed)
 Established Patient Office Visit  Subjective:  Patient ID: Ruth Porter, female    DOB: 01/06/1933  Age: 89 y.o. MRN: [id number]  CC: Primary care follow up   HPI 07/2021 Ruth Porter presents for primary care follow-up The patient noticed over the last month she has had 2 falls without injury.  1 when she slipped in the mud the other when she was walking her dog.  She lives in an independent apartment not in assisted living.  She is having trouble organizing her papers and letters and mail.  She denies any memory deficits.  She has not had physical therapy previously.  She is up-to-date on all her vaccines.  We did receive all her records from [facility] at the last visit.  She has had all her pneumonia vaccines and flu vaccines.  She declines to receive further tetanus vaccines at this visit.  She is interested in finishing her  shingles shot series.  She had an initial injection in 2012 and has not had a follow-up injection for shingles she has upcoming appointments with cardiology in March.  She also has follow-up with her eye doctor upcoming.  Patient's cholesterol is mildly elevated she is treating this with diet alone.  She is tolerating Lexapro  at 5 mg daily without difficulty.  Also maintain Synthroid  50 mcg daily thyroid  function was normal at the last visit.   11/07/2021 Patient seen in return follow-up and since the last visit she has undergone physical therapy and doing well.  She has had no further falls.  She had some numbness in the left leg when she slept on her leg and on fashion after going to bed late and sleeping in the next day.  The numbness is gone.  On arrival blood pressure 139/83.  She needs to have her lipids rechecked.  She has no other complaints at this time.  She is on the Lexapro  5 mg daily.  She is taking multivitamins and simvastatin .  The patient has joined the [employer] and plans to continue with this program  05/30/23 This patient is  seen in return follow-up has not been seen since 2023 by myself however did see PA Mcclung in May of this year and patient's Lexapro  was resumed she continue with the Synthroid  TSH was normal  Biggest complaint is she has moved into a different apartment and her belongings have yet to be unpacked and this is causing stress she is also had increasing loss of balance but she has not had any falls.  She cannot get to the gym because of transportation barriers.  She can no longer drive.  She is having treatment for macular degeneration with retinal injections.  She agrees to receive the flu vaccine at this visit.  10/31/23 This patient is seen in return follow-up accompanied by her daughter and is a very pleasant 89 year old female who has been doing quite well with her physical status and mental status.  She belongs to [employer] goes every Tuesday for swimming and light exercise this has helped her health status significantly.  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  She had questions about spironolactone use wanted me to evaluate that.  I did review this and it is experimental only not yet approved for what macular degeneration.  She has a retinal doctor and she is going to follow-up with that doctor.  There are no other complaints blood pressure normal  on arrival. Past Medical History:  Diagnosis Date   Anxiety    Cataract    right   Diverticulosis    Hemorrhoids    Hyperlipemia    Hypothyroidism    Reflux     Past Surgical History:  Procedure Laterality Date   APPENDECTOMY     BILATERAL SALPINGOOPHORECTOMY     TOTAL ABDOMINAL HYSTERECTOMY      Family History  Problem Relation Age of Onset   Heart attack Mother    Stroke Mother     Social History   Socioeconomic History   Marital status: Married    Spouse name: Not on file   Number of children: Not on file   Years of education: Not on file   Highest  education level: Not on file  Occupational History   Not on file  Tobacco Use   Smoking status: Never   Smokeless tobacco: Never  Vaping Use   Vaping status: Never Used  Substance and Sexual Activity   Alcohol use: Not Currently   Drug use: Not Currently   Sexual activity: Not on file  Other Topics Concern   Not on file  Social History Narrative   Not on file   Social Drivers of Health   Financial Resource Strain: Low Risk  (10/11/2022)   Overall Financial Resource Strain (CARDIA)    Difficulty of Paying Living Expenses: Not hard at all  Food Insecurity: No Food Insecurity (07/15/2023)   Hunger Vital Sign    Worried About Running Out of Food in the Last Year: Never true    Ran Out of Food in the Last Year: Never true  Transportation Needs: No Transportation Needs (07/15/2023)   PRAPARE - Administrator, Civil Service (Medical): No    Lack of Transportation (Non-Medical): No  Recent Concern: Transportation Needs - Unmet Transportation Needs (07/01/2023)   PRAPARE - Transportation    Lack of Transportation (Medical): Yes    Lack of Transportation (Non-Medical): Yes  Physical Activity: Sufficiently Active (10/11/2022)   Exercise Vital Sign    Days of Exercise per Week: 7 days    Minutes of Exercise per Session: 30 min  Stress: No Stress Concern Present (10/11/2022)   [employer] of Occupational Health - Occupational Stress Questionnaire    Feeling of Stress : Not at all  Social Connections: Not on file  Intimate Partner Violence: Not At Risk (07/15/2023)   Humiliation, Afraid, Rape, and Kick questionnaire    Fear of Current or Ex-Partner: No    Emotionally Abused: No    Physically Abused: No    Sexually Abused: No    Outpatient Medications Prior to Visit  Medication Sig Dispense Refill   Apoaequorin (PREVAGEN PO) Take by mouth.     aspirin EC 81 MG tablet Take 81 mg by mouth daily. Swallow whole.     Cholecalciferol (VITAMIN D) 125 MCG (5000 UT) CAPS  Take 5,000 Units by mouth daily.     Multiple Vitamins-Minerals (CENTRUM ULTRA WOMENS PO) Take by mouth.     Multiple Vitamins-Minerals (PRESERVISION/LUTEIN PO) Take by mouth.     escitalopram  (LEXAPRO ) 5 MG tablet Take 1 tablet (5 mg total) by mouth daily. 90 tablet 1   levothyroxine  (SYNTHROID ) 50 MCG tablet TAKE 1 TABLET(50 MCG) BY MOUTH DAILY. NEED APPOINTMENT FOR REFILLS 90 tablet 0   No facility-administered medications prior to visit.    Allergies  Allergen Reactions   Statins Other (See Comments)    Vision changes  Sulfa Antibiotics Other (See Comments)    Pt allergic to sulfa but does not know reaction    ROS Review of Systems  Constitutional:  Negative for chills, diaphoresis and fever.  HENT:  Negative for congestion, hearing loss, nosebleeds, sore throat and tinnitus.   Eyes:  Positive for visual disturbance. Negative for photophobia and redness.  Respiratory:  Negative for cough, shortness of breath, wheezing and stridor.   Cardiovascular:  Negative for chest pain, palpitations and leg swelling.  Gastrointestinal:  Negative for abdominal pain, blood in stool, constipation, diarrhea, nausea and vomiting.  Endocrine: Negative for polydipsia.  Genitourinary:  Negative for dysuria, flank pain, frequency, hematuria and urgency.  Musculoskeletal:  Negative for back pain, myalgias and neck pain.  Skin:  Negative for rash.  Allergic/Immunologic: Negative for environmental allergies.  Neurological:  Negative for dizziness, tremors, seizures, weakness and headaches.       Balance issues but no recent falls  Hematological:  Does not bruise/bleed easily.  Psychiatric/Behavioral:  Negative for suicidal ideas. The patient is not nervous/anxious.       Objective:    Physical Exam Vitals reviewed.  Constitutional:      Appearance: Normal appearance. She is well-developed. She is not diaphoretic.  HENT:     Head: Normocephalic and atraumatic.     Nose: No nasal deformity,  septal deviation, mucosal edema or rhinorrhea.     Right Sinus: No maxillary sinus tenderness or frontal sinus tenderness.     Left Sinus: No maxillary sinus tenderness or frontal sinus tenderness.     Mouth/Throat:     Pharynx: No oropharyngeal exudate.  Eyes:     General: No scleral icterus.    Conjunctiva/sclera: Conjunctivae normal.     Pupils: Pupils are equal, round, and reactive to light.  Neck:     Thyroid : No thyromegaly.     Vascular: No carotid bruit or JVD.     Trachea: Trachea normal. No tracheal tenderness or tracheal deviation.  Cardiovascular:     Rate and Rhythm: Normal rate and regular rhythm.     Chest Wall: PMI is not displaced.     Pulses: Normal pulses. No decreased pulses.     Heart sounds: Normal heart sounds, S1 normal and S2 normal. Heart sounds not distant. No murmur heard.    No systolic murmur is present.     No diastolic murmur is present.     No friction rub. No gallop. No S3 or S4 sounds.  Pulmonary:     Effort: No tachypnea, accessory muscle usage or respiratory distress.     Breath sounds: No stridor. No decreased breath sounds, wheezing, rhonchi or rales.  Chest:     Chest wall: No tenderness.  Abdominal:     General: Bowel sounds are normal. There is no distension.     Palpations: Abdomen is soft. Abdomen is not rigid.     Tenderness: There is no abdominal tenderness. There is no guarding or rebound.  Musculoskeletal:        General: Normal range of motion.     Cervical back: Normal range of motion and neck supple. No edema, erythema or rigidity. No muscular tenderness. Normal range of motion.  Lymphadenopathy:     Head:     Right side of head: No submental or submandibular adenopathy.     Left side of head: No submental or submandibular adenopathy.     Cervical: No cervical adenopathy.  Skin:    General: Skin is warm and dry.  Coloration: Skin is not pale.     Findings: No rash.     Nails: There is no clubbing.  Neurological:      General: No focal deficit present.     Mental Status: She is alert and oriented to person, place, and time. Mental status is at baseline.     Cranial Nerves: No cranial nerve deficit.     Sensory: No sensory deficit.     Motor: No weakness.     Coordination: Coordination normal.     Gait: Gait abnormal.     Deep Tendon Reflexes: Reflexes normal.     Comments: No falls improved balance with therapy  Psychiatric:        Speech: Speech normal.        Behavior: Behavior normal.     BP 110/73 (BP Location: Left Arm, Patient Position: Sitting, Cuff Size: Normal)   Pulse 76   Resp 19   Ht 5\' 3"  (1.6 m)   Wt 122 lb 9.6 oz (55.6 kg)   SpO2 98%   BMI 21.72 kg/m  Wt Readings from Last 3 Encounters:  10/31/23 122 lb 9.6 oz (55.6 kg)  05/30/23 124 lb 6.4 oz (56.4 kg)  03/05/23 124 lb (56.2 kg)     Health Maintenance Due  Topic Date Due   Zoster Vaccines- Shingrix (1 of 2) 10/07/1951   DTaP/Tdap/Td (3 - Td or Tdap) 05/06/2019   Medicare Annual Wellness (AWV)  10/11/2023    There are no preventive care reminders to display for this patient.  Lab Results  Component Value Date   TSH 2.430 11/19/2022   Lab Results  Component Value Date   WBC 5.6 11/19/2022   HGB 12.8 11/19/2022   HCT 39.5 11/19/2022   MCV 83 11/19/2022   PLT 230 11/19/2022   Lab Results  Component Value Date   NA 141 03/05/2023   K 4.2 03/05/2023   CO2 24 03/05/2023   GLUCOSE 108 (H) 03/05/2023   BUN 26 03/05/2023   CREATININE 0.86 03/05/2023   BILITOT 0.5 11/20/2021   ALKPHOS 100 11/20/2021   AST 26 11/20/2021   ALT 16 11/20/2021   PROT 6.5 11/20/2021   ALBUMIN 4.3 11/20/2021   CALCIUM  9.9 03/05/2023   ANIONGAP 8 12/14/2020   EGFR 65 03/05/2023   Lab Results  Component Value Date   CHOL 133 11/20/2021   Lab Results  Component Value Date   HDL 56 11/20/2021   Lab Results  Component Value Date   LDLCALC 62 11/20/2021   Lab Results  Component Value Date   TRIG 76 11/20/2021   Lab  Results  Component Value Date   CHOLHDL 2.4 11/20/2021   No results found for: "HGBA1C"    Assessment & Plan:   Problem List Items Addressed This Visit       Endocrine   Hypothyroidism   Thyroid  function stable refill medication      Relevant Medications   levothyroxine  (SYNTHROID ) 50 MCG tablet     Nervous and Auditory   Sensorineural hearing loss (SNHL), bilateral - Primary   Patient wearing hearing aids appears to have decreased hearing she is requested to check with her audiologist for another hearing test        Other   Mixed hyperlipidemia   Cannot tolerate statins continue a healthy diet      Generalized anxiety disorder   Much improved with Lexapro  continue same      Relevant Medications   escitalopram  (LEXAPRO ) 5 MG  tablet   Personal history of fall   No longer falling frequently much improved with use of the [employer] program        Meds ordered this encounter  Medications   escitalopram  (LEXAPRO ) 5 MG tablet    Sig: Take 1 tablet (5 mg total) by mouth daily.    Dispense:  90 tablet    Refill:  2   levothyroxine  (SYNTHROID ) 50 MCG tablet    Sig: Take 1 tablet (50 mcg total) by mouth daily before breakfast. TAKE 1 TABLET(50 MCG) BY MOUTH DAILY.    Dispense:  90 tablet    Refill:  2  As I have retired the patient will be referred to [facility] for ongoing primary care follow-up with Dr. Nicolette Barrio  Follow-up: Return for primary care follow up.    Arlene Lacy, MD

## 2023-10-31 ENCOUNTER — Encounter: Payer: Self-pay | Admitting: Critical Care Medicine

## 2023-10-31 ENCOUNTER — Ambulatory Visit: Payer: Medicare (Managed Care) | Attending: Critical Care Medicine | Admitting: Critical Care Medicine

## 2023-10-31 VITALS — BP 110/73 | HR 76 | Resp 19 | Ht 63.0 in | Wt 122.6 lb

## 2023-10-31 DIAGNOSIS — H903 Sensorineural hearing loss, bilateral: Secondary | ICD-10-CM

## 2023-10-31 DIAGNOSIS — F411 Generalized anxiety disorder: Secondary | ICD-10-CM

## 2023-10-31 DIAGNOSIS — E782 Mixed hyperlipidemia: Secondary | ICD-10-CM

## 2023-10-31 DIAGNOSIS — Z9181 History of falling: Secondary | ICD-10-CM

## 2023-10-31 DIAGNOSIS — E039 Hypothyroidism, unspecified: Secondary | ICD-10-CM

## 2023-10-31 MED ORDER — LEVOTHYROXINE SODIUM 50 MCG PO TABS: 50.0000 ug | ORAL_TABLET | Freq: Every day | ORAL | 2 refills | Status: DC

## 2023-10-31 MED ORDER — ESCITALOPRAM OXALATE 5 MG PO TABS: 5.0000 mg | ORAL_TABLET | Freq: Every day | ORAL | 2 refills | Status: DC

## 2023-10-31 NOTE — Patient Instructions (Addendum)
 No change in medications 90-day supplies with refills sent to your pharmacy  Continue to follow-up with your retinal doctor you can discuss the benefit of spironolactone with the doctor  Referral to internal medicine Primary care with Dr. Bambi Lever will be made  Our clinic will continue to cover any request that you have until that time  Continue to enjoy the benefits of the [facility]

## 2023-11-01 NOTE — Assessment & Plan Note (Signed)
 Patient wearing hearing aids appears to have decreased hearing she is requested to check with her audiologist for another hearing test

## 2023-11-01 NOTE — Assessment & Plan Note (Signed)
 No longer falling frequently much improved with use of the [employer] program

## 2023-11-01 NOTE — Assessment & Plan Note (Signed)
 Thyroid  function stable refill medication

## 2023-11-01 NOTE — Assessment & Plan Note (Signed)
 Cannot tolerate statins continue a healthy diet

## 2023-11-01 NOTE — Assessment & Plan Note (Signed)
 Much improved with Lexapro  continue same

## 2023-11-12 ENCOUNTER — Ambulatory Visit: Payer: Medicare (Managed Care) | Attending: Critical Care Medicine

## 2023-12-20 ENCOUNTER — Telehealth: Payer: Self-pay | Admitting: Critical Care Medicine

## 2023-12-20 NOTE — Telephone Encounter (Signed)
 Copied from CRM [id number]. Topic: Appointments - Scheduling Inquiry for Clinic >> Dec 20, 2023  2:26 PM Ruth Porter wrote: Reason for CRM: Patient is the mother of a patient of Dr. Constance Porter, [name]. She is looking to change PCP's and wants to inquire if Dr. Nicolette Barrio would be able to take her on as a new patient. She is currently a patient of Dr. Brent Cambric.

## 2024-01-15 ENCOUNTER — Telehealth: Payer: Self-pay | Admitting: Critical Care Medicine

## 2024-01-15 NOTE — Telephone Encounter (Signed)
Called patient, Unable to reach patient.

## 2024-01-15 NOTE — Telephone Encounter (Signed)
 Copied from CRM [id number]. Topic: General - Other >> Jan 15, 2024  9:48 AM Travis F wrote:  Reason for CRM: Patient's daughter is calling in because she wants to know who the doctor was that Dr. Brien recommended patient go to when he retired. Please follow up with patient's daughter.

## 2024-01-21 ENCOUNTER — Ambulatory Visit: Payer: Self-pay | Admitting: Adult Health

## 2024-02-07 ENCOUNTER — Ambulatory Visit: Payer: Medicare (Managed Care) | Admitting: Family

## 2024-02-07 ENCOUNTER — Encounter: Payer: Self-pay | Admitting: Family

## 2024-02-07 VITALS — BP 112/70 | HR 75 | Temp 97.6°F | Resp 18 | Ht 63.0 in | Wt 124.6 lb

## 2024-02-07 DIAGNOSIS — H3530 Unspecified macular degeneration: Secondary | ICD-10-CM

## 2024-02-07 DIAGNOSIS — E782 Mixed hyperlipidemia: Secondary | ICD-10-CM | POA: Diagnosis not present

## 2024-02-07 DIAGNOSIS — H903 Sensorineural hearing loss, bilateral: Secondary | ICD-10-CM | POA: Diagnosis not present

## 2024-02-07 DIAGNOSIS — R252 Cramp and spasm: Secondary | ICD-10-CM

## 2024-02-07 DIAGNOSIS — E039 Hypothyroidism, unspecified: Secondary | ICD-10-CM

## 2024-02-07 DIAGNOSIS — R413 Other amnesia: Secondary | ICD-10-CM

## 2024-02-07 DIAGNOSIS — F411 Generalized anxiety disorder: Secondary | ICD-10-CM | POA: Diagnosis not present

## 2024-02-07 DIAGNOSIS — Z9181 History of falling: Secondary | ICD-10-CM

## 2024-02-07 MED ORDER — ESCITALOPRAM OXALATE 5 MG PO TABS: 5.0000 mg | ORAL_TABLET | Freq: Every day | ORAL | 2 refills | Status: AC

## 2024-02-07 MED ORDER — LEVOTHYROXINE SODIUM 50 MCG PO TABS: 50.0000 ug | ORAL_TABLET | Freq: Every day | ORAL | 2 refills | Status: AC

## 2024-02-11 ENCOUNTER — Other Ambulatory Visit: Payer: Medicare (Managed Care)

## 2024-02-11 LAB — COMPLETE METABOLIC PANEL WITHOUT GFR
AG Ratio: 1.9 (calc) (ref 1.0–2.5)
ALT: 24 U/L (ref 6–29)
AST: 39 U/L — ABNORMAL HIGH (ref 10–35)
Albumin: 4.1 g/dL (ref 3.6–5.1)
Alkaline phosphatase (APISO): 83 U/L (ref 37–153)
BUN: 20 mg/dL (ref 7–25)
CO2: 25 mmol/L (ref 20–32)
Calcium: 9.4 mg/dL (ref 8.6–10.4)
Chloride: 106 mmol/L (ref 98–110)
Creat: 0.83 mg/dL (ref 0.60–0.95)
Globulin: 2.2 g/dL (ref 1.9–3.7)
Glucose, Bld: 87 mg/dL (ref 65–99)
Potassium: 4.2 mmol/L (ref 3.5–5.3)
Sodium: 141 mmol/L (ref 135–146)
Total Bilirubin: 0.6 mg/dL (ref 0.2–1.2)
Total Protein: 6.3 g/dL (ref 6.1–8.1)

## 2024-02-12 ENCOUNTER — Ambulatory Visit: Payer: Self-pay | Admitting: Family

## 2024-02-12 ENCOUNTER — Other Ambulatory Visit: Payer: Medicare (Managed Care)

## 2024-02-12 DIAGNOSIS — R7401 Elevation of levels of liver transaminase levels: Secondary | ICD-10-CM

## 2024-02-14 NOTE — Progress Notes (Signed)
 Provider: Roxan Plough FNP-C   Jaquon Gingerich, Roxan BROCKS, NP  Patient Care Team: Judyth Demarais, Roxan BROCKS, NP as PCP - General (Family Medicine)  Extended Emergency Contact Information Primary Emergency Contact: [facility] Phone: [phone] Relation: Daughter Preferred language: English Interpreter needed? No Secondary Emergency Contact: [place] Mobile Phone: [phone] Relation: Daughter Preferred language: English Interpreter needed? No  Code Status:  Full Code  Goals of care: Advanced Directive information    02/07/2024    1:37 PM  Advanced Directives  Does Patient Have a Medical Advance Directive? Yes  Type of Estate agent of [place];Living will  Does patient want to make changes to medical advance directive? No - Patient declined  Copy of Healthcare Power of Attorney in Chart? No - copy requested     Chief Complaint  Patient presents with   Establish Care    New patient appointment    Discussed the use of AI scribe software for clinical note transcription with the patient, who gave verbal consent to proceed.  History of Present Illness   Ruth Porter is an 89 year old female who presents to establish care. She is accompanied by her family members.  She has a history of macular degeneration in both eyes, with the left eye transitioning from wet to dry form, while the right eye remains stable. She receives eye injections every four weeks.  She experiences anxiety and takes Lexapro  (escitalopram ) daily, which she reports helps with her energy levels. She also takes levothyroxine  for her thyroid  condition, which she takes on an empty stomach in the morning. She is unsure when her thyroid  levels were last checked.  She takes a baby aspirin irregularly and uses Privigen for memory support. She has some difficulty with name recall, which she attributes to her late sleeping habits, often going to bed around 1 AM. She does not nap  during the day.  She has a history of falls, including a wrist fracture, but has not had any recent falls. She does not use a cane or walker and manages her own cooking and cleaning with occasional help for deep cleaning.  Her diet typically includes two to three meals a day, with breakfast around 10 AM and a light meal around 3 PM. She sometimes skips breakfast if she is busy. She drinks tea with collagen and honey but admits to not drinking much water.  She recently had a UTI treated with antibiotics, which she believes improved her bowel habits. No current urinary symptoms.  She experiences leg cramps at night and has visible varicose veins but does not use compression stockings. She occasionally drinks pickle juice to alleviate cramps.  She is a widow of five years, walks her dog three times a day, and goes to the gym once a week. She no longer drives and relies on someone to take her grocery shopping weekly.    Past Medical History:  Diagnosis Date   Anxiety    Cataract    right   Diverticulosis    Hemorrhoids    Hyperlipemia    Hypothyroidism    Reflux    Past Surgical History:  Procedure Laterality Date   APPENDECTOMY     BILATERAL SALPINGOOPHORECTOMY     TOTAL ABDOMINAL HYSTERECTOMY      Allergies  Allergen Reactions   Statins Other (See Comments)    Vision changes   Sulfa Antibiotics Other (See Comments)    Pt allergic to sulfa but does not know reaction    Allergies  as of 02/07/2024       Reactions   Statins Other (See Comments)   Vision changes   Sulfa Antibiotics Other (See Comments)   Pt allergic to sulfa but does not know reaction        Medication List        Accurate as of February 07, 2024 11:59 PM. If you have any questions, ask your nurse or doctor.          aspirin EC 81 MG tablet Take 81 mg by mouth daily. Swallow whole.   escitalopram  5 MG tablet Commonly known as: Lexapro  Take 1 tablet (5 mg total) by mouth daily.   levothyroxine  50  MCG tablet Commonly known as: SYNTHROID  Take 1 tablet (50 mcg total) by mouth daily before breakfast. TAKE 1 TABLET(50 MCG) BY MOUTH DAILY.   PRESERVISION/LUTEIN PO Take by mouth.   CENTRUM ULTRA WOMENS PO Take by mouth.   PREVAGEN PO Take by mouth.   Vitamin D 125 MCG (5000 UT) Caps Take 5,000 Units by mouth daily.        Review of Systems  Constitutional:  Negative for appetite change, chills, fatigue, fever and unexpected weight change.  HENT:  Negative for congestion, dental problem, ear discharge, ear pain, facial swelling, hearing loss, nosebleeds, postnasal drip, rhinorrhea, sinus pressure, sinus pain, sneezing, sore throat, tinnitus and trouble swallowing.   Eyes:  Positive for visual disturbance. Negative for pain, discharge, redness and itching.       Macular degeneration   Respiratory:  Negative for cough, chest tightness, shortness of breath and wheezing.   Cardiovascular:  Negative for chest pain, palpitations and leg swelling.  Gastrointestinal:  Negative for abdominal distention, abdominal pain, blood in stool, constipation, diarrhea, nausea and vomiting.  Endocrine: Negative for cold intolerance, heat intolerance, polydipsia, polyphagia and polyuria.  Genitourinary:  Negative for difficulty urinating, dysuria, flank pain, frequency and urgency.  Musculoskeletal:  Negative for arthralgias, back pain, gait problem, joint swelling, myalgias, neck pain and neck stiffness.       Leg cramps   Skin:  Negative for color change, pallor, rash and wound.  Neurological:  Negative for dizziness, syncope, speech difficulty, weakness, light-headedness, numbness and headaches.  Hematological:  Does not bruise/bleed easily.  Psychiatric/Behavioral:  Negative for agitation, behavioral problems, confusion, hallucinations, self-injury, sleep disturbance and suicidal ideas. The patient is nervous/anxious.        Memory loss     Immunization History  Administered Date(s)  Administered   Fluad Trivalent(High Dose 65+) 05/30/2023   Influenza, High Dose Seasonal PF 05/18/2014, 05/04/2016, 05/17/2021   PFIZER(Purple Top)SARS-COV-2 Vaccination 12/29/2019   Pneumococcal Conjugate-13 06/20/2015   Pneumococcal Polysaccharide-23 05/09/2010   Respiratory Syncytial Virus Vaccine ,Recomb Aduvanted(Arexvy ) 07/01/2023   Td 05/05/2009   Tdap 05/05/2009   Zoster, Live 06/04/2011   Pertinent  Health Maintenance Due  Topic Date Due   INFLUENZA VACCINE  02/14/2024   DEXA SCAN  Completed      11/07/2021    3:13 PM 05/09/2022    4:50 PM 10/11/2022   10:40 AM 10/31/2023    3:32 PM 02/07/2024    1:36 PM  Fall Risk  Falls in the past year? 0  0 0 0  Was there an injury with Fall? 0  0 0 0  Fall Risk Category Calculator 0  0 0 0  Fall Risk Category (Retired) Low       (RETIRED) Patient Fall Risk Level Low fall risk  Low fall risk  Patient at Risk for Falls Due to No Fall Risks  Medication side effect No Fall Risks No Fall Risks  Fall risk Follow up   Falls prevention discussed;Education provided;Falls evaluation completed Falls evaluation completed Falls evaluation completed     Data saved with a previous flowsheet row definition   Functional Status Survey:    Vitals:   02/07/24 1339  BP: 112/70  Pulse: 75  Resp: 18  Temp: 97.6 F (36.4 C)  SpO2: 94%  Weight: 124 lb 9.6 oz (56.5 kg)  Height: 5' 3 (1.6 m)   Body mass index is 22.07 kg/m. Physical Exam  GENERAL: Alert, cooperative, well developed, no acute distress HEENT: Normocephalic, normal oropharynx, moist mucous membranes, ears normal NECK: Neck normal CHEST: Clear to auscultation bilaterally, no wheezes, rhonchi, or crackles, no chest wall tenderness CARDIOVASCULAR: Normal heart rate and rhythm, S1 and S2 normal without murmurs ABDOMEN: Soft, non-tender, non-distended, without organomegaly, normal bowel sounds EXTREMITIES: No cyanosis or edema, extremities normal MUSCULOSKELETAL: Spine,  shoulders, hands, hips, and knees normal NEUROLOGICAL: Cranial nerves grossly intact, moves all extremities without gross motor or sensory deficit  SKIN: No rash,no lesion or erythema   PSYCHIATRY/BEHAVIORAL: Mood stable   Labs reviewed: Recent Labs    03/05/23 1624 02/11/24 1023  NA 141 141  K 4.2 4.2  CL 103 106  CO2 24 25  GLUCOSE 108* 87  BUN 26 20  CREATININE 0.86 0.83  CALCIUM  9.9 9.4   Recent Labs    02/11/24 1023  AST 39*  ALT 24  BILITOT 0.6  PROT 6.3   No results for input(s): WBC, NEUTROABS, HGB, HCT, MCV, PLT in the last 8760 hours. Lab Results  Component Value Date   TSH 2.430 11/19/2022   No results found for: HGBA1C Lab Results  Component Value Date   CHOL 133 11/20/2021   HDL 56 11/20/2021   LDLCALC 62 11/20/2021   TRIG 76 11/20/2021   CHOLHDL 2.4 11/20/2021    Significant Diagnostic Results in last 30 days:  No results found.  Assessment/Plan  Generalized Anxiety Disorder Intermittent anxiety managed effectively with Lexapro  (escitalopram ). Emphasized medication adherence and monitoring for side effects. - Refill Lexapro  prescription at [facility].  Hypothyroidism On levothyroxine , taken on an empty stomach in the morning. No recent thyroid  function test to assess current levels. Highlighted the need for regular monitoring to adjust dosage. - Order thyroid  function test. - Refill levothyroxine  prescription at [facility].  Memory Concerns Reports difficulty with name recall but otherwise good memory. Memory test indicated mild issues. Emphasized regular meals and hydration for cognitive support. - Conduct baseline memory test. - Encourage regular meals and hydration.  Leg Cramps Experiences nocturnal leg cramps, possibly related to varicose veins. Does not use compression stockings. Discussed benefits of magnesium supplementation and compression stockings. - Recommend wearing compression stockings. - Consider magnesium  supplementation.  Urinary Tract Infection (UTI) Recently treated UTI with antibiotics resolved symptoms. Advised increased water intake to prevent recurrence. - Encourage increased water intake.  Age-related Macular Degeneration Macular degeneration in both eyes; left eye progressing towards dry type. Under Dr. London care with eye injections every four weeks.  General Health Maintenance Active lifestyle with regular dog walking and gym attendance. Takes multivitamin and vitamin D. No recent falls but has a wrist fracture history. Discussed hydration, regular meals, sleep hygiene, vaccinations, and screenings. - Schedule fasting blood work. - Schedule six-month follow-up appointment. - Schedule annual Medicare visit. - Encourage flu vaccination before flu season. - Recommend shingles vaccination. -  Recommend tetanus booster. - Advise on sleep hygiene and reducing late-night activities.   Family/ staff Communication: Reviewed plan of care with patient verbalized understanding   Labs/tests ordered:  - CBC with Differential/Platelet - CMP with eGFR([facility]) - TSH - Lipid panel  Next Appointment : Return in about 6 months (around 08/09/2024) for medical mangement of chronic issues., fasting labs in one week.annual wellness soon.   Spent 30 minutes of Face to face and non-face to face with patient  >50% time spent counseling; reviewing medical record; tests; labs; documentation and developing future plan of care.   Roxan JAYSON Plough, NP

## 2024-03-10 ENCOUNTER — Encounter: Payer: Medicare (Managed Care) | Admitting: Family

## 2024-03-13 NOTE — Progress Notes (Signed)
   This encounter was created in error - please disregard. No show

## 2024-07-12 ENCOUNTER — Other Ambulatory Visit: Payer: Self-pay

## 2024-07-12 ENCOUNTER — Emergency Department (HOSPITAL_COMMUNITY)
Admission: EM | Admit: 2024-07-12 | Discharge: 2024-07-13 | Discharge status: Against Medical Advice | Payer: Medicare (Managed Care) | Source: Home / Self Care

## 2024-07-12 DIAGNOSIS — K625 Hemorrhage of anus and rectum: Secondary | ICD-10-CM | POA: Insufficient documentation

## 2024-07-12 DIAGNOSIS — E039 Hypothyroidism, unspecified: Secondary | ICD-10-CM | POA: Diagnosis not present

## 2024-07-12 DIAGNOSIS — Z5321 Procedure and treatment not carried out due to patient leaving prior to being seen by health care provider: Secondary | ICD-10-CM | POA: Insufficient documentation

## 2024-07-12 DIAGNOSIS — I2510 Atherosclerotic heart disease of native coronary artery without angina pectoris: Secondary | ICD-10-CM | POA: Diagnosis not present

## 2024-07-12 DIAGNOSIS — R32 Unspecified urinary incontinence: Secondary | ICD-10-CM | POA: Insufficient documentation

## 2024-07-12 DIAGNOSIS — K5731 Diverticulosis of large intestine without perforation or abscess with bleeding: Secondary | ICD-10-CM | POA: Diagnosis not present

## 2024-07-12 DIAGNOSIS — R300 Dysuria: Secondary | ICD-10-CM | POA: Insufficient documentation

## 2024-07-12 DIAGNOSIS — Z7982 Long term (current) use of aspirin: Secondary | ICD-10-CM | POA: Diagnosis not present

## 2024-07-12 DIAGNOSIS — K921 Melena: Secondary | ICD-10-CM | POA: Diagnosis present

## 2024-07-12 DIAGNOSIS — D62 Acute posthemorrhagic anemia: Secondary | ICD-10-CM | POA: Diagnosis not present

## 2024-07-12 LAB — BASIC METABOLIC PANEL WITH GFR
Anion gap: 9 (ref 5–15)
BUN: 23 mg/dL (ref 8–23)
CO2: 27 mmol/L (ref 22–32)
Calcium: 9.4 mg/dL (ref 8.9–10.3)
Chloride: 104 mmol/L (ref 98–111)
Creatinine, Ser: 0.99 mg/dL (ref 0.44–1.00)
GFR, Estimated: 54 mL/min — ABNORMAL LOW
Glucose, Bld: 101 mg/dL — ABNORMAL HIGH (ref 70–99)
Potassium: 3.6 mmol/L (ref 3.5–5.1)
Sodium: 140 mmol/L (ref 135–145)

## 2024-07-12 LAB — URINALYSIS, ROUTINE W REFLEX MICROSCOPIC
Bilirubin Urine: NEGATIVE
Glucose, UA: NEGATIVE mg/dL
Hgb urine dipstick: NEGATIVE
Ketones, ur: NEGATIVE mg/dL
Nitrite: NEGATIVE
Protein, ur: 30 mg/dL — AB
Specific Gravity, Urine: 1.02 (ref 1.005–1.030)
WBC, UA: >50 WBC/hpf (ref 0–5)
pH: 5 (ref 5.0–8.0)

## 2024-07-12 LAB — CBC
HCT: 40 % (ref 36.0–46.0)
Hemoglobin: 12.9 g/dL (ref 12.0–15.0)
MCH: 27.4 pg (ref 26.0–34.0)
MCHC: 32.3 g/dL (ref 30.0–36.0)
MCV: 84.9 fL (ref 80.0–100.0)
Platelets: 233 K/uL (ref 150–400)
RBC: 4.71 MIL/uL (ref 3.87–5.11)
RDW: 14.6 % (ref 11.5–15.5)
WBC: 6.6 K/uL (ref 4.0–10.5)
nRBC: 0 % (ref 0.0–0.2)

## 2024-07-12 NOTE — ED Triage Notes (Signed)
 Patient reports 1 episode of large amount of bloody stool with clots this evening , denies emesis , no inury or fall , she does not take anticoagulant medication .

## 2024-07-12 NOTE — ED Provider Triage Note (Signed)
 Emergency Medicine Provider Triage Evaluation Note  Ruth Porter , a 89 y.o. female  was evaluated in triage.  Pt complains of bright red blood per rectum.  Patient episode of loose stool today and passed bright red blood with clots, this is the first time this has ever happened, she is not on anticoagulants.  Notes some urinary incontinence issues lately with dysuria noted today, no fevers.  Denies abdominal pain.  Reports when she googled her symptoms she was told that she may have a ruptured colon, this prompted her to come into the emergency department.  Review of Systems  Positive: As above  Negative: As above  Physical Exam  BP (!) 137/95   Pulse 77   Temp 97.7 F (36.5 C)   Resp 18   SpO2 99%  Gen:   Awake, no distress   Resp:  Normal effort  MSK:   Moves extremities without difficulty  Other:  Abdomen is soft and nontender on exam  Medical Decision Making  Medically screening exam initiated at 8:45 PM.  Appropriate orders placed.  Ruth Porter was informed that the remainder of the evaluation will be completed by another provider, this initial triage assessment does not replace that evaluation, and the importance of remaining in the ED until their evaluation is complete.     Ruth Porter, [place] 07/12/24 2045

## 2024-07-13 ENCOUNTER — Ambulatory Visit: Payer: Self-pay

## 2024-07-13 ENCOUNTER — Emergency Department (HOSPITAL_BASED_OUTPATIENT_CLINIC_OR_DEPARTMENT_OTHER): Payer: Medicare (Managed Care)

## 2024-07-13 ENCOUNTER — Other Ambulatory Visit: Payer: Self-pay

## 2024-07-13 ENCOUNTER — Encounter (HOSPITAL_BASED_OUTPATIENT_CLINIC_OR_DEPARTMENT_OTHER): Payer: Self-pay | Admitting: Emergency Medicine

## 2024-07-13 ENCOUNTER — Observation Stay (HOSPITAL_BASED_OUTPATIENT_CLINIC_OR_DEPARTMENT_OTHER)
Admission: EM | Admit: 2024-07-13 | Discharge: 2024-07-15 | Disposition: A | Discharge status: Home / Self Care | Payer: Medicare (Managed Care) | Attending: Internal Medicine | Admitting: Internal Medicine

## 2024-07-13 DIAGNOSIS — D62 Acute posthemorrhagic anemia: Secondary | ICD-10-CM

## 2024-07-13 DIAGNOSIS — K625 Hemorrhage of anus and rectum: Principal | ICD-10-CM | POA: Diagnosis present

## 2024-07-13 DIAGNOSIS — Z7982 Long term (current) use of aspirin: Secondary | ICD-10-CM | POA: Insufficient documentation

## 2024-07-13 DIAGNOSIS — I2510 Atherosclerotic heart disease of native coronary artery without angina pectoris: Secondary | ICD-10-CM | POA: Insufficient documentation

## 2024-07-13 DIAGNOSIS — K922 Gastrointestinal hemorrhage, unspecified: Principal | ICD-10-CM | POA: Diagnosis present

## 2024-07-13 DIAGNOSIS — K921 Melena: Secondary | ICD-10-CM | POA: Diagnosis not present

## 2024-07-13 DIAGNOSIS — E039 Hypothyroidism, unspecified: Secondary | ICD-10-CM | POA: Insufficient documentation

## 2024-07-13 DIAGNOSIS — K5731 Diverticulosis of large intestine without perforation or abscess with bleeding: Secondary | ICD-10-CM

## 2024-07-13 DIAGNOSIS — Z743 Need for continuous supervision: Secondary | ICD-10-CM | POA: Diagnosis not present

## 2024-07-13 LAB — CBC WITH DIFFERENTIAL/PLATELET
Abs Immature Granulocytes: 0.03 K/uL (ref 0.00–0.07)
Basophils Absolute: 0 K/uL (ref 0.0–0.1)
Basophils Relative: 0 %
Eosinophils Absolute: 0.1 K/uL (ref 0.0–0.5)
Eosinophils Relative: 2 %
HCT: 39.4 % (ref 36.0–46.0)
Hemoglobin: 12.7 g/dL (ref 12.0–15.0)
Immature Granulocytes: 0 %
Lymphocytes Relative: 10 %
Lymphs Abs: 0.9 K/uL (ref 0.7–4.0)
MCH: 27 pg (ref 26.0–34.0)
MCHC: 32.2 g/dL (ref 30.0–36.0)
MCV: 83.7 fL (ref 80.0–100.0)
Monocytes Absolute: 0.5 K/uL (ref 0.1–1.0)
Monocytes Relative: 5 %
Neutro Abs: 7.7 K/uL (ref 1.7–7.7)
Neutrophils Relative %: 83 %
Platelets: 236 K/uL (ref 150–400)
RBC: 4.71 MIL/uL (ref 3.87–5.11)
RDW: 14.8 % (ref 11.5–15.5)
WBC: 9.3 K/uL (ref 4.0–10.5)
nRBC: 0 % (ref 0.0–0.2)

## 2024-07-13 LAB — URINALYSIS, ROUTINE W REFLEX MICROSCOPIC
Bilirubin Urine: NEGATIVE
Glucose, UA: NEGATIVE mg/dL
Ketones, ur: NEGATIVE mg/dL
Nitrite: POSITIVE — AB
Protein, ur: 100 mg/dL — AB
RBC / HPF: >50 RBC/hpf (ref 0–5)
Specific Gravity, Urine: 1.023 (ref 1.005–1.030)
WBC, UA: >50 WBC/hpf (ref 0–5)
pH: 5.5 (ref 5.0–8.0)

## 2024-07-13 LAB — COMPREHENSIVE METABOLIC PANEL WITH GFR
ALT: 17 U/L (ref 0–44)
AST: 27 U/L (ref 15–41)
Albumin: 4.3 g/dL (ref 3.5–5.0)
Alkaline Phosphatase: 99 U/L (ref 38–126)
Anion gap: 10 (ref 5–15)
BUN: 19 mg/dL (ref 8–23)
CO2: 25 mmol/L (ref 22–32)
Calcium: 10 mg/dL (ref 8.9–10.3)
Chloride: 105 mmol/L (ref 98–111)
Creatinine, Ser: 0.97 mg/dL (ref 0.44–1.00)
GFR, Estimated: 56 mL/min — ABNORMAL LOW
Glucose, Bld: 137 mg/dL — ABNORMAL HIGH (ref 70–99)
Potassium: 4.4 mmol/L (ref 3.5–5.1)
Sodium: 140 mmol/L (ref 135–145)
Total Bilirubin: 0.8 mg/dL (ref 0.0–1.2)
Total Protein: 6.9 g/dL (ref 6.5–8.1)

## 2024-07-13 LAB — OCCULT BLOOD X 1 CARD TO LAB, STOOL: Fecal Occult Bld: POSITIVE — AB

## 2024-07-13 LAB — LIPASE, BLOOD: Lipase: 55 U/L — ABNORMAL HIGH (ref 11–51)

## 2024-07-13 MED ORDER — PANTOPRAZOLE SODIUM 40 MG IV SOLR
40.0000 mg | Freq: Once | INTRAVENOUS | Status: AC
  Administered 2024-07-13: 40 mg via INTRAVENOUS
  Filled 2024-07-13: qty 10

## 2024-07-13 MED ORDER — IOHEXOL 350 MG/ML SOLN
100.0000 mL | Freq: Once | INTRAVENOUS | Status: AC | PRN
  Administered 2024-07-13: 80 mL via INTRAVENOUS

## 2024-07-13 MED ORDER — CEFTRIAXONE SODIUM 1 G IJ SOLR
1.0000 g | Freq: Once | INTRAMUSCULAR | Status: AC
  Administered 2024-07-13: 1 g via INTRAVENOUS
  Filled 2024-07-13: qty 10

## 2024-07-13 NOTE — Telephone Encounter (Signed)
 FYI Only or Action Required?: FYI only for provider: ED advised.  Patient was last seen in primary care on 02/07/2024 by Ngetich, Roxan BROCKS, NP.  Called Nurse Triage reporting Rectal Bleeding.  Symptoms began yesterday.  Interventions attempted: Rest, hydration, or home remedies.  Symptoms are: unchanged.  Triage Disposition: Go to ED Now (or PCP Triage)  Patient/caregiver understands and will follow disposition?: Yes  Copied from CRM [id number]. Topic: Clinical - Red Word Triage >> Jul 13, 2024 11:04 AM Graeme ORN wrote: Red Word that prompted transfer to Nurse Triage: Bloody diahrrea   ----------------------------------------------------------------------- From previous Reason for Contact - Scheduling: Patient/patient representative is calling to schedule an appointment. Refer to attachments for appointment information. Reason for Disposition  Patient sounds very sick or weak to the triager  Answer Assessment - Initial Assessment Questions Patient's daughter called to report bloody diarrhea. Patient was seen in the ED last night but left without being seen. Patient reports passing significant amount of blood last night stating It was like a complete miscarriage. Did report a second episode of passing blood but less than the first. Did recommend for patient to be seen again in the ED as no appointments available in office. Daughter verbalized understanding.   1. APPEARANCE of BLOOD: What color is it? Is it passed separately, on the surface of the stool, or mixed in with the stool?      Passed separately 2. AMOUNT: How much blood was passed?      unsure 3. FREQUENCY: How many times has blood been passed with the stools?      twice 4. ONSET: When was the blood first seen in the stools? (Days or weeks)      yesterday 5. DIARRHEA: Is there also some diarrhea? If Yes, ask: How many diarrhea stools in the past 24 hours?      possibly 6. CONSTIPATION: Do you have  constipation? If Yes, ask: How bad is it?     no 7. RECURRENT SYMPTOMS: Have you had blood in your stools before? If Yes, ask: When was the last time? and What happened that time?      no 8. BLOOD THINNERS: Do you take any blood thinners? (e.g., aspirin , clopidogrel / Plavix, coumadin, heparin). Notes: Other strong blood thinners include: Arixtra (fondaparinux), Eliquis (apixaban), Pradaxa (dabigatran), and Xarelto (rivaroxaban).     no 9. OTHER SYMPTOMS: Do you have any other symptoms?  (e.g., abdomen pain, vomiting, dizziness, fever)     no  Protocols used: Rectal Bleeding-A-AH

## 2024-07-13 NOTE — ED Provider Triage Note (Signed)
 Emergency Medicine Provider Triage Evaluation Note  BETHANNE MULE , a 89 y.o. female  was evaluated in triage.  Pt complains of 2 episodes of hematochezia in the past 24 hours. Went to [facility] last night and LWBS. Mild suprapubic abdominal pain which she states feels like cystitis. Denies nausea or vomiting. Denies blood thinners.   Review of Systems  Positive:  Negative:   Physical Exam  There were no vitals taken for this visit. Gen:   Awake, no distress   Resp:  Normal effort  MSK:   Moves extremities without difficulty  Other:    Medical Decision Making  Medically screening exam initiated at 12:48 PM.  Appropriate orders placed.  Zakya M Muela was informed that the remainder of the evaluation will be completed by another provider, this initial triage assessment does not replace that evaluation, and the importance of remaining in the ED until their evaluation is complete.     Hoy Nidia FALCON, [place] 07/13/24 1249

## 2024-07-13 NOTE — Plan of Care (Signed)
 Plan of Care Note for accepted transfer   Patient: Ruth Porter MRN: [id number]   DOA: 07/13/2024  Facility requesting transfer: [facility]  Requesting Provider: Nidia Mays, PA-C  Reason for transfer: Rectal Bleeding; BRBPR  Facility course: The patient is an 89 year old Caucasian female with a past medical history significant for but not limited to diverticulosis, hemorrhoids, hyperlipidemia, hypothyroidism, GERD who presented to the ED with bright red blood per rectum and 2 episodes of hematochezia in the last 24 hours.  She went to [facility] last night for her rectal bleeding and left without being seen and then also endorses suprapubic abdominal pain which she feels like is similar to her history of cystitis.  She presented to the ED and was hemodynamically stable and had suprapubic pain and mild bloating.  Rectal examination showed some bright red blood as well as dark blood.  FOBT was positive.  The EDP is obtaining a CT of the abdomen pelvis rectal bleed scan.  GI was consulted and recommended inpatient observation and they will follow-up in the a.m. given that she is hemodynamic stable she was accepted to the telemetry bed at [facility] as an observation  Plan of care: The patient is accepted for admission to Telemetry unit, at [facility]   Author: Alejandro Marker, DO [facility] 07/13/2024  Check [email or web address] for on-call coverage.  Nursing staff, Please call [facility] Admits & Consults System-Wide number on Amion as soon as patient's arrival, so appropriate admitting provider can evaluate the pt.

## 2024-07-13 NOTE — ED Provider Notes (Signed)
 " [place] EMERGENCY DEPARTMENT AT [facility] Provider Note   CSN: [id number] Arrival date & time: 07/13/24  1153     Patient presents with: Blood In Stools   Ruth Porter is a 89 y.o. female with PMHx diverticulosis, hemorrhoids, HLD, Hypothyroidism, GERD who presents to ED concerned for 2 episodes of hematochezia in the past 24 hours. Went to [facility] last night and LWBS. Also endorses mild suprapubic abdominal pain which she states feels like cystitis. Denies nausea or vomiting. Denies blood thinners.   HPI     Prior to Admission medications  Medication Sig Start Date End Date Taking? Authorizing Provider  Apoaequorin (PREVAGEN PO) Take by mouth.    [provider]  aspirin  EC 81 MG tablet Take 81 mg by mouth daily. Swallow whole.    [provider]  Cholecalciferol (VITAMIN D) 125 MCG (5000 UT) CAPS Take 5,000 Units by mouth daily.    [provider]  escitalopram  (LEXAPRO ) 5 MG tablet Take 1 tablet (5 mg total) by mouth daily. 02/07/24   Ngetich, Dinah C, NP  levothyroxine  (SYNTHROID ) 50 MCG tablet Take 1 tablet (50 mcg total) by mouth daily before breakfast. TAKE 1 TABLET(50 MCG) BY MOUTH DAILY. 02/07/24   Ngetich, Dinah C, NP  Multiple Vitamins-Minerals (CENTRUM ULTRA WOMENS PO) Take by mouth.    [provider]  Multiple Vitamins-Minerals (PRESERVISION/LUTEIN PO) Take by mouth.    [provider]    Allergies: Statins and Sulfa antibiotics    Review of Systems  Genitourinary:        Hematochezia    Updated Vital Signs BP 136/82 (BP Location: Right Arm)   Pulse 71   Temp 98.3 F (36.8 C) (Oral)   Resp 18   SpO2 97%   Physical Exam Vitals and nursing note reviewed.  Constitutional:      General: She is not in acute distress.    Appearance: She is not ill-appearing or toxic-appearing.  HENT:     Head: Normocephalic and atraumatic.     Mouth/Throat:     Mouth: Mucous membranes are moist.  Eyes:      General: No scleral icterus.       Right eye: No discharge.        Left eye: No discharge.     Conjunctiva/sclera: Conjunctivae normal.  Cardiovascular:     Rate and Rhythm: Normal rate and regular rhythm.     Pulses: Normal pulses.     Heart sounds: Normal heart sounds. No murmur heard. Pulmonary:     Effort: Pulmonary effort is normal. No respiratory distress.     Breath sounds: Normal breath sounds. No wheezing, rhonchi or rales.  Abdominal:     General: Abdomen is flat. Bowel sounds are normal. There is no distension.     Palpations: Abdomen is soft. There is no mass.     Tenderness: There is no abdominal tenderness.  Genitourinary:    Comments: RN Alan present for rectal exam: Multiple hemorrhoids. Stool sample obtained appeared black/melenic with bright red blood. Musculoskeletal:     Right lower leg: No edema.     Left lower leg: No edema.  Skin:    General: Skin is warm and dry.     Findings: No rash.  Neurological:     General: No focal deficit present.     Mental Status: She is alert. Mental status is at baseline.  Psychiatric:        Mood and Affect: Mood normal.     (  all labs ordered are listed, but only abnormal results are displayed) Labs Reviewed  COMPREHENSIVE METABOLIC PANEL WITH GFR - Abnormal; Notable for the following components:      Result Value   Glucose, Bld 137 (*)    GFR, Estimated 56 (*)    All other components within normal limits  LIPASE, BLOOD - Abnormal; Notable for the following components:   Lipase 55 (*)    All other components within normal limits  URINALYSIS, ROUTINE W REFLEX MICROSCOPIC - Abnormal; Notable for the following components:   APPearance CLOUDY (*)    Hgb urine dipstick LARGE (*)    Protein, ur 100 (*)    Nitrite POSITIVE (*)    Leukocytes,Ua LARGE (*)    Bacteria, UA RARE (*)    Non Squamous Epithelial 0-5 (*)    All other components within normal limits  OCCULT BLOOD X 1 CARD TO LAB, STOOL - Abnormal; Notable for  the following components:   Fecal Occult Bld POSITIVE (*)    All other components within normal limits  CBC WITH DIFFERENTIAL/PLATELET    EKG: None  Radiology: CT ANGIO GI BLEED Result Date: 07/13/2024 EXAM: CTA ABDOMEN AND PELVIS WITH CONTRAST 07/13/2024 04:20:31 PM TECHNIQUE: CTA images of the abdomen and pelvis with intravenous contrast. 80 mL of iohexol  (OMNIPAQUE ) 350 MG/ML injection 100 mL IOHEXOL  350 MG/ML SOLN was administered. Three-dimensional MIP/volume rendered formations were performed. Automated exposure control, iterative reconstruction, and/or weight based adjustment of the mA/kV was utilized to reduce the radiation dose to as low as reasonably achievable. COMPARISON: None available. CLINICAL HISTORY: Lower GI bleed (Ped 0-17y). FINDINGS: VASCULATURE: GI BLEED: There is no active gastrointestinal bleeding identified. AORTA: There is mild calcified atherosclerotic disease throughout the aorta. No acute finding. No abdominal aortic aneurysm. No dissection. CELIAC TRUNK: No acute finding. No occlusion or significant stenosis. SUPERIOR MESENTERIC ARTERY: No acute finding. No occlusion or significant stenosis. INFERIOR MESENTERIC ARTERY: No acute finding. No occlusion or significant stenosis. RENAL ARTERIES: No acute finding. No occlusion or significant stenosis. ILIAC ARTERIES: No acute finding. No occlusion or significant stenosis. ABDOMEN/PELVIS: LOWER CHEST: Visualized portion of the lower chest demonstrates no acute abnormality. LIVER: The liver is unremarkable. GALLBLADDER AND BILE DUCTS: Gallbladder is unremarkable. No biliary ductal dilatation. SPLEEN: The spleen is unremarkable. PANCREAS: The pancreas is unremarkable. ADRENAL GLANDS: Bilateral adrenal glands demonstrate no acute abnormality. KIDNEYS, URETERS AND BLADDER: There is a subcentimeter cyst in the right kidney. The bladder wall is mildly thickened with surrounding inflammation. No stones in the kidneys or ureters. No  hydronephrosis. No perinephric or periureteral stranding. GI AND BOWEL: Stomach and duodenal sweep demonstrate no acute abnormality. Sigmoid colon diverticulosis. The appendix is not seen. There is no bowel obstruction. No abnormal bowel wall thickening or distension. REPRODUCTIVE: The uterus is surgically absent. PERITONEUM AND RETROPERITONEUM: No ascites or free air. LYMPH NODES: No lymphadenopathy. BONES AND SOFT TISSUES: Degenerative changes of the spine. No acute abnormality of the bones. No acute soft tissue abnormality. IMPRESSION: 1. No active gastrointestinal bleeding identified. 2. Mildly thickened urinary bladder wall with surrounding inflammation, suspicious for cystitis. 3. Sigmoid colon diverticulosis without evidence of diverticulitis. 4. Subcentimeter right renal cyst, benign with no follow-up imaging recommended. Electronically signed by: Greig Pique MD 07/13/2024 06:24 PM EST RP Workstation: [id number]     .Critical Care  Performed by: Hoy Nidia FALCON, PA-C Authorized by: Hoy Nidia FALCON, PA-C   Critical care provider statement:    Critical care time (minutes):  30  Critical care was necessary to treat or prevent imminent or life-threatening deterioration of the following conditions: GI bleed.   Critical care was time spent personally by me on the following activities:  Development of treatment plan with patient or surrogate, discussions with consultants, evaluation of patient's response to treatment, examination of patient, ordering and review of laboratory studies, ordering and review of radiographic studies, ordering and performing treatments and interventions, pulse oximetry, re-evaluation of patient's condition and review of old charts   Care discussed with: admitting provider      Medications Ordered in the ED  iohexol  (OMNIPAQUE ) 350 MG/ML injection 100 mL (80 mLs Intravenous Contrast Given 07/13/24 1609)  cefTRIAXone  (ROCEPHIN ) 1 g in sodium chloride 0.9 % 100  mL IVPB (0 g Intravenous Stopped 07/13/24 1757)  pantoprazole  (PROTONIX ) injection 40 mg (40 mg Intravenous Given 07/13/24 1758)                                    Medical Decision Making Amount and/or Complexity of Data Reviewed Labs: ordered. Radiology: ordered.  Risk Prescription drug management. Decision regarding hospitalization.    This patient presents to the ED for concern of abdominal pain, this involves an extensive number of treatment options, and is a complaint that carries with it a high risk of complications and morbidity.  The differential diagnosis includes gastroenteritis, colitis, small bowel obstruction, appendicitis, cholecystitis, pancreatitis, nephrolithiasis, UTI, pyleonephritis   Co morbidities that complicate the patient evaluation  diverticulosis, hemorrhoids, HLD, Hypothyroidism, GERD   Additional history obtained:  Dr. Leonarda PCP    Problem List / ED Course / Critical interventions / Medication management  Patient presented for hematochezia. Also has mild bloating and suprapubic pain. Physical exam concerning for melenic stool. Rest of physical exam reassuring. Patient afebrile with stable vitals.  I Ordered, and personally interpreted labs.  CBC without leukocytosis or anemia. CMP reassuring. UA concerning for infection. POC occult positive. Lipase mildly elevated at 55.  I ordered imaging studies including CT Abd/Pelvis. I independently visualized and interpreted imaging and I agree with the radiologist interpretation of cystitis and diverticulosis. I requested consultation with the GI provider on call Dr. Elicia,  and discussed lab and imaging findings as well as pertinent plan - they recommend: admission and will follow along. Consulted with Dr. Alejandro who agrees to admit patient. I have reviewed the patients home medicines and have made adjustments as needed     Social Determinants of Health:  geriatric       Final diagnoses:   Gastrointestinal hemorrhage, unspecified gastrointestinal hemorrhage type    ED Discharge Orders     None          Hoy Nidia FALCON, [place] 07/13/24 1830  "

## 2024-07-13 NOTE — ED Notes (Signed)
 Pt left without being seen. Pt marked as eloped.

## 2024-07-13 NOTE — ED Triage Notes (Signed)
 Blood in stool x 2 since last night labs done at [facility] last night Labs done at [facility] lwbs due to wait Some lower abdo pain

## 2024-07-14 DIAGNOSIS — K5731 Diverticulosis of large intestine without perforation or abscess with bleeding: Secondary | ICD-10-CM | POA: Diagnosis not present

## 2024-07-14 DIAGNOSIS — D62 Acute posthemorrhagic anemia: Secondary | ICD-10-CM | POA: Diagnosis not present

## 2024-07-14 DIAGNOSIS — K625 Hemorrhage of anus and rectum: Secondary | ICD-10-CM | POA: Diagnosis not present

## 2024-07-14 DIAGNOSIS — K922 Gastrointestinal hemorrhage, unspecified: Secondary | ICD-10-CM | POA: Diagnosis present

## 2024-07-14 LAB — CBC
HCT: 33.8 % — ABNORMAL LOW (ref 36.0–46.0)
Hemoglobin: 11 g/dL — ABNORMAL LOW (ref 12.0–15.0)
MCH: 27.3 pg (ref 26.0–34.0)
MCHC: 32.5 g/dL (ref 30.0–36.0)
MCV: 83.9 fL (ref 80.0–100.0)
Platelets: 207 K/uL (ref 150–400)
RBC: 4.03 MIL/uL (ref 3.87–5.11)
RDW: 14.7 % (ref 11.5–15.5)
WBC: 8 K/uL (ref 4.0–10.5)
nRBC: 0 % (ref 0.0–0.2)

## 2024-07-14 LAB — COMPREHENSIVE METABOLIC PANEL WITH GFR
ALT: 13 U/L (ref 0–44)
AST: 27 U/L (ref 15–41)
Albumin: 3.7 g/dL (ref 3.5–5.0)
Alkaline Phosphatase: 83 U/L (ref 38–126)
Anion gap: 10 (ref 5–15)
BUN: 19 mg/dL (ref 8–23)
CO2: 25 mmol/L (ref 22–32)
Calcium: 9.3 mg/dL (ref 8.9–10.3)
Chloride: 107 mmol/L (ref 98–111)
Creatinine, Ser: 0.99 mg/dL (ref 0.44–1.00)
GFR, Estimated: 54 mL/min — ABNORMAL LOW
Glucose, Bld: 101 mg/dL — ABNORMAL HIGH (ref 70–99)
Potassium: 3.7 mmol/L (ref 3.5–5.1)
Sodium: 141 mmol/L (ref 135–145)
Total Bilirubin: 0.4 mg/dL (ref 0.0–1.2)
Total Protein: 5.9 g/dL — ABNORMAL LOW (ref 6.5–8.1)

## 2024-07-14 LAB — VITAMIN B12: Vitamin B-12: 535 pg/mL (ref 180–914)

## 2024-07-14 LAB — MAGNESIUM: Magnesium: 2.4 mg/dL (ref 1.7–2.4)

## 2024-07-14 LAB — RETICULOCYTES
Immature Retic Fract: 6.3 % (ref 2.3–15.9)
RBC.: 3.97 MIL/uL (ref 3.87–5.11)
Retic Count, Absolute: 45.3 K/uL (ref 19.0–186.0)
Retic Ct Pct: 1.1 % (ref 0.4–3.1)

## 2024-07-14 LAB — IRON AND TIBC
Iron: 29 ug/dL (ref 28–170)
Saturation Ratios: 12 % (ref 10.4–31.8)
TIBC: 241 ug/dL — ABNORMAL LOW (ref 250–450)
UIBC: 212 ug/dL

## 2024-07-14 LAB — FERRITIN: Ferritin: 68 ng/mL (ref 11–307)

## 2024-07-14 LAB — PHOSPHORUS: Phosphorus: 3.3 mg/dL (ref 2.5–4.6)

## 2024-07-14 LAB — FOLATE: Folate: 14.8 ng/mL

## 2024-07-14 MED ORDER — ACETAMINOPHEN 650 MG RE SUPP: 650.0000 mg | Freq: Four times a day (QID) | RECTAL | Status: DC | PRN

## 2024-07-14 MED ORDER — LEVOTHYROXINE SODIUM 50 MCG PO TABS
50.0000 ug | ORAL_TABLET | Freq: Every day | ORAL | Status: DC
  Administered 2024-07-14 – 2024-07-15 (×2): 50 ug via ORAL
  Filled 2024-07-14 (×2): qty 1

## 2024-07-14 MED ORDER — ACETAMINOPHEN 325 MG PO TABS: 650.0000 mg | ORAL_TABLET | Freq: Four times a day (QID) | ORAL | Status: DC | PRN

## 2024-07-14 MED ORDER — ASPIRIN 81 MG PO TBEC
81.0000 mg | DELAYED_RELEASE_TABLET | Freq: Every day | ORAL | Status: DC
  Administered 2024-07-14 – 2024-07-15 (×2): 81 mg via ORAL
  Filled 2024-07-14 (×2): qty 1

## 2024-07-14 MED ORDER — ONDANSETRON HCL 4 MG/2ML IJ SOLN: 4.0000 mg | Freq: Four times a day (QID) | INTRAMUSCULAR | Status: DC | PRN

## 2024-07-14 MED ORDER — SODIUM CHLORIDE 0.9 % IV SOLN
1.0000 g | INTRAVENOUS | Status: DC
  Administered 2024-07-14: 1 g via INTRAVENOUS
  Filled 2024-07-14: qty 10

## 2024-07-14 MED ORDER — ONDANSETRON HCL 4 MG PO TABS: 4.0000 mg | ORAL_TABLET | Freq: Four times a day (QID) | ORAL | Status: DC | PRN

## 2024-07-14 MED ORDER — ESCITALOPRAM OXALATE 10 MG PO TABS
5.0000 mg | ORAL_TABLET | Freq: Every day | ORAL | Status: DC
  Administered 2024-07-14 – 2024-07-15 (×2): 5 mg via ORAL
  Filled 2024-07-14 (×2): qty 1

## 2024-07-14 NOTE — Progress Notes (Signed)
" °   07/14/24 0919  TOC Brief Assessment  Insurance and Status Reviewed  Patient has primary care physician Yes  Home environment has been reviewed Condo  Prior level of function: Independent  Prior/Current Home Services No current home services  Social Drivers of Health Review SDOH reviewed no interventions necessary  Readmission risk has been reviewed Yes  Transition of care needs transition of care needs identified, TOC will continue to follow    Signed: Heather Saltness, MSW, LCSW Clinical Social Worker Inpatient Care Management 07/14/2024 9:20 AM   "

## 2024-07-14 NOTE — Care Management CC44 (Signed)
"         Condition Code 44 Documentation Completed  Patient Details  Name: Ruth Porter MRN: [id number] Date of Birth: February 15, 1933   Condition Code 44 given:  Yes Patient signature on Condition Code 44 notice:  Yes Documentation of 2 MD's agreement:  Yes Code 44 added to claim:  Yes    Tymon Nemetz A Nadea Kirkland, LCSW 07/14/2024, 1:35 PM  "

## 2024-07-14 NOTE — Hospital Course (Signed)
 Care started prior to midnight in the emergency room and she is admitted early this morning after midnight by Dr. Lamar Dess and I am in current agreement with this assessment and plan.  Additional changes to the plan of care have been made accordingly.  The patient is an elderly 89 year old Caucasian female with a past medical history significant for but not limited to anxiety, hyperlipidemia, hypothyroidism, GERD, internal hemorrhoids and diverticulosis who presented to the ED with large volume of dark red blood per the rectum.  She underwent CTA bleeding scan this was negative for active GI bleeding but did show sigmoid diverticulosis without evidence of diverticulitis as well as a thickened urinary bladder and subcentimeter right renal cyst.    Given her painless hematochezia she was admitted for observation and the GI team has been consulted and recommending a clear liquid diet as well as supportive care.  There are no current plans for invasive endoscopic procedure at this time but the GI team recommends to continue monitor closely overnight to ensure no further drop in hemoglobin or active bleeding noted.  They are currently recommending transfusing for hemoglobin less than 7 or as needed if symptomatic.  Blood count has now dropped to 11.0 33.8 and will need to continue monitor for further signs of bleeding but she states that she has not had any more bloody bowel movements.  Will continue to monitor the patient's clinical response to intervention and repeat blood work in the a.m. and follow-up on the specialist recommendations.

## 2024-07-14 NOTE — H&P (Signed)
 " History and Physical    Ruth Porter FMW:[id number] DOB: 16-Jan-1933 DOA: 07/13/2024  PCP: [name], NP   Chief Complaint: hematochezia  HPI: Ruth Porter is a 89 y.o. female with medical history significant of hypertension, hyperlipidemia, hypothyroidism who presented to the emergency ferment with hematochezia.  Patient has had blood loss in the stool for the last 24 hours as well as suprapubic abdominal pain.  She presented to [facility] where she was found to be afebrile and hemodynamically stable.  At outside facility [facility] GI was contacted who recommended admission.  Patient was transferred to [facility] for further management.  Labs were obtained on presentation which showed CMP unrevealing, WBC 9.3, hemoglobin 12.7, urinalysis concerning for infection.  Patient underwent CTA which showed no active bleeding and diverticulosis with possible cystitis.  Patient was admitted for further workup.   Review of Systems: Review of Systems  All other systems reviewed and are negative.    As per HPI otherwise 10 point review of systems negative.   Allergies[1]  Past Medical History:  Diagnosis Date   Anxiety    Cataract    right   Diverticulosis    Hemorrhoids    Hyperlipemia    Hypothyroidism    Reflux     Past Surgical History:  Procedure Laterality Date   APPENDECTOMY     BILATERAL SALPINGOOPHORECTOMY     TOTAL ABDOMINAL HYSTERECTOMY       reports that she has never smoked. She has never used smokeless tobacco. She reports that she does not currently use alcohol. She reports that she does not currently use drugs.  Family History  Problem Relation Age of Onset   Heart attack Mother    Stroke Mother     Prior to Admission medications  Medication Sig Start Date End Date Taking? Authorizing Provider  Apoaequorin (PREVAGEN PO) Take by mouth.    [provider]  aspirin  EC 81 MG tablet Take 81 mg by mouth daily. Swallow whole.    [provider]  Cholecalciferol (VITAMIN D) 125 MCG (5000 UT) CAPS Take 5,000 Units by mouth daily.    [provider]  escitalopram  (LEXAPRO ) 5 MG tablet Take 1 tablet (5 mg total) by mouth daily. 02/07/24   Ngetich, Dinah C, NP  levothyroxine  (SYNTHROID ) 50 MCG tablet Take 1 tablet (50 mcg total) by mouth daily before breakfast. TAKE 1 TABLET(50 MCG) BY MOUTH DAILY. 02/07/24   Ngetich, Dinah C, NP  Multiple Vitamins-Minerals (CENTRUM ULTRA WOMENS PO) Take by mouth.    [provider]  Multiple Vitamins-Minerals (PRESERVISION/LUTEIN PO) Take by mouth.    [provider]    Physical Exam: Vitals:   07/13/24 1249 07/13/24 1709 07/13/24 2214  BP: 127/85 136/82 116/63  Pulse: 90 71 84  Resp: 17 18 18   Temp: 99.1 F (37.3 C) 98.3 F (36.8 C) 98.1 F (36.7 C)  TempSrc: Oral Oral Oral  SpO2: 96% 97% 98%   Physical Exam Constitutional:      Appearance: She is normal weight.  HENT:     Head: Normocephalic.     Nose: Nose normal.     Mouth/Throat:     Mouth: Mucous membranes are moist.     Pharynx: Oropharynx is clear.  Eyes:     Pupils: Pupils are equal, round, and reactive to light.  Cardiovascular:     Rate and Rhythm: Normal rate and regular rhythm.     Pulses: Normal pulses.  Pulmonary:  Effort: Pulmonary effort is normal.  Abdominal:     General: Abdomen is flat. Bowel sounds are normal.  Musculoskeletal:        General: Normal range of motion.     Cervical back: Normal range of motion.  Skin:    General: Skin is warm.     Capillary Refill: Capillary refill takes less than 2 seconds.  Neurological:     General: No focal deficit present.     Mental Status: She is alert.  Psychiatric:        Mood and Affect: Mood normal.        Labs on Admission: I have personally reviewed the patients's labs and imaging studies.  Assessment/Plan Principal Problem:   Rectal bleeding Active Problems:   GI bleeding   # Hematochezia most likely  secondary to diverticular bleeding - Hemoglobin at baseline - Has diverticulosis on CT - CT scan without extravasation  Plan: Check hemoglobin in morning Remain on clear liquid diet # Urinary tract infection-continue ceftriaxone   # CAD-continue aspirin   # Hypothyroidism-continue levothyroxine    Admission status: Inpatient Telemetry  Certification: The appropriate patient status for this patient is INPATIENT. Inpatient status is judged to be reasonable and necessary in order to provide the required intensity of service to ensure the patient's safety. The patient's presenting symptoms, physical exam findings, and initial radiographic and laboratory data in the context of their chronic comorbidities is felt to place them at high risk for further clinical deterioration. Furthermore, it is not anticipated that the patient will be medically stable for discharge from the hospital within 2 midnights of admission.   * I certify that at the point of admission it is my clinical judgment that the patient will require inpatient hospital care spanning beyond 2 midnights from the point of admission due to high intensity of service, high risk for further deterioration and high frequency of surveillance required.DEWAINE Lamar Dess MD [facility] If 7PM-7AM, please contact night-coverage [email or web address]  07/14/2024, 1:00 AM        [1]  Allergies Allergen Reactions   Statins Other (See Comments)    Vision changes   Sulfa Antibiotics Other (See Comments)    Pt allergic to sulfa but does not know reaction   "

## 2024-07-14 NOTE — Plan of Care (Signed)
  Problem: Education: Goal: Knowledge of General Education information will improve Description: Including pain rating scale, medication(s)/side effects and non-pharmacologic comfort measures Outcome: Progressing   Problem: Clinical Measurements: Goal: Will remain free from infection Outcome: Progressing   Problem: Safety: Goal: Ability to remain free from injury will improve Outcome: Progressing   

## 2024-07-14 NOTE — Consult Note (Addendum)
 "  Referring Provider: Dr. Alejandro Marker Primary Care Physician:  [name], NP Primary Gastroenterologist:  Dr. Norleen Kiang in 2006  Reason for Consultation: Hematochezia  HPI: Ruth Porter is a 89 y.o. female with past medical history of anxiety, hyperlipidemia, hypothyroidism, GERD, internal hemorrhoids and diverticulosis.  She presented to [facility] ED 07/12/2024 after passing a large volume of dark red blood per the rectum x 1. WBC 6.6.  Hemoglobin 12.9 which is baseline.  Hematocrit 40.  Platelets 233.  BUN 23.  Creatinine 0.99.  Urinalysis with large amount of leukocytes.  Patient left the ED without being evaluated by the ED physician.  After she returned home, she passed a small volume of dark red blood with mucous per the rectum and a similar episodes x 1 on 12/29. No abdominal associated abdominal pain. She presented to [facility] ED 07/13/2024 for further evaluation.  WBC 9.3.  Hemoglobin 12.7.  Platelets 236.  Sodium 140.  Potassium 4.4.  BUN 19.  Creatinine 0.97.  Normal LFTs.  Lipase 55.  Urinalysis positive for nitrites, leukocytes and hemoglobin.  FOBT positive.  CTA was negative for active GI bleeding, showed sigmoid diverticulosis without evidence of diverticulitis, mildly thickened urinary bladder and a subcentimeter right renal cyst.  Labs today: WBC 8.0.  Hemoglobin 11.0.  Hematocrit 33.8.  Platelets 207.  BUN 19.  Creatinine 0.99.  Iron 29.  TIBC 241.  Ferritin 68.  B12 level 535.  Folate 14.8.  A GI consult was requested for further evaluation regarding painless hematochezia.  She endorsed keeping quite active during the Christmas holiday season.  She infrequently takes ASA 81 mg 1 tablet daily if she feels fatigued, few doses within the past month.  She had a slight cough and took Advil 1 tab sporadically x 3 doses over the past month as well.  Not on anticoagulation.  No GERD symptoms.  She sometimes has difficulty swallowing chicken which  briefly gets stuck in the upper esophagus and passes in a few seconds and she is able to resume eating.  No upper or lower abdominal pain.  She has intermittent constipation and strains to pass a BM.  She infrequently sees a small amount of bright red blood on the toilet tissue which she attributes to having hemorrhoids.  However, on 12/28 she thought she passed wind and saw a small amount of dark red blood and mucus on her under pad.  Shortly after, she went to the bathroom and passed a rush of dark red blood with clots.  She denied passing any bright red blood or melena.  She underwent a colonoscopy by Dr. Kiang 07/2004 which showed diverticulosis to the descending sigmoid colon and internal hemorrhoids.  She denies having any further colonoscopies since then.  No prior history of lower GI bleed or diverticulitis.  She lives independently.  Her daughter is at the bedside.  GI PROCEDURES:  Colonoscopy 08/14/2004 by Dr. Kiang: Diverticulosis in the descending and sigmoid colon Internal hemorrhoids  Past Medical History:  Diagnosis Date   Anxiety    Cataract    right   Diverticulosis    Hemorrhoids    Hyperlipemia    Hypothyroidism    Reflux     Past Surgical History:  Procedure Laterality Date   APPENDECTOMY     BILATERAL SALPINGOOPHORECTOMY     TOTAL ABDOMINAL HYSTERECTOMY      Prior to Admission medications  Medication Sig Start Date End Date Taking? Authorizing Provider  Apoaequorin (  PREVAGEN PO) Take by mouth.    [provider]  aspirin  EC 81 MG tablet Take 81 mg by mouth daily. Swallow whole.    [provider]  Cholecalciferol (VITAMIN D) 125 MCG (5000 UT) CAPS Take 5,000 Units by mouth daily.    [provider]  escitalopram  (LEXAPRO ) 5 MG tablet Take 1 tablet (5 mg total) by mouth daily. 02/07/24   Ngetich, Dinah C, NP  levothyroxine  (SYNTHROID ) 50 MCG tablet Take 1 tablet (50 mcg total) by mouth daily before breakfast. TAKE 1 TABLET(50 MCG) BY MOUTH  DAILY. 02/07/24   Ngetich, Dinah C, NP  Multiple Vitamins-Minerals (CENTRUM ULTRA WOMENS PO) Take by mouth.    [provider]  Multiple Vitamins-Minerals (PRESERVISION/LUTEIN PO) Take by mouth.    [provider]    Current Facility-Administered Medications  Medication Dose Route Frequency Provider Last Rate Last Admin   acetaminophen (TYLENOL) tablet 650 mg  650 mg Oral Q6H PRN Dena Charleston, MD       Or   acetaminophen (TYLENOL) suppository 650 mg  650 mg Rectal Q6H PRN Dena Charleston, MD       aspirin  EC tablet 81 mg  81 mg Oral Daily Dorrell, Robert, MD       cefTRIAXone  (ROCEPHIN ) 1 g in sodium chloride 0.9 % 100 mL IVPB  1 g Intravenous Q24H Dorrell, Robert, MD       escitalopram  (LEXAPRO ) tablet 5 mg  5 mg Oral Daily Dorrell, Robert, MD   5 mg at 07/14/24 9071   levothyroxine  (SYNTHROID ) tablet 50 mcg  50 mcg Oral QAC breakfast Dena Charleston, MD   50 mcg at 07/14/24 0557   ondansetron (ZOFRAN) tablet 4 mg  4 mg Oral Q6H PRN Dena Charleston, MD       Or   ondansetron (ZOFRAN) injection 4 mg  4 mg Intravenous Q6H PRN Dena Charleston, MD        Allergies as of 07/13/2024 - Review Complete 07/13/2024  Allergen Reaction Noted   Statins Other (See Comments) 08/15/2021   Sulfa antibiotics Other (See Comments) 12/03/2020    Family History  Problem Relation Age of Onset   Heart attack Mother    Stroke Mother     Social History   Socioeconomic History   Marital status: Married    Spouse name: Not on file   Number of children: Not on file   Years of education: Not on file   Highest education level: Not on file  Occupational History   Not on file  Tobacco Use   Smoking status: Never   Smokeless tobacco: Never  Vaping Use   Vaping status: Never Used  Substance and Sexual Activity   Alcohol use: Not Currently   Drug use: Not Currently   Sexual activity: Not on file  Other Topics Concern   Not on file  Social History Narrative   Not on file    Social Drivers of Health   Tobacco Use: Low Risk (07/13/2024)   Patient History    Smoking Tobacco Use: Never    Smokeless Tobacco Use: Never    Passive Exposure: Not on file  Financial Resource Strain: Low Risk (10/11/2022)   Overall Financial Resource Strain (CARDIA)    Difficulty of Paying Living Expenses: Not hard at all  Food Insecurity: No Food Insecurity (07/14/2024)   Epic    Worried About Radiation Protection Practitioner of Food in the Last Year: Never true    Ran Out of Food in the Last Year:  Never true  Transportation Needs: Unmet Transportation Needs (07/14/2024)   Epic    Lack of Transportation (Medical): Yes    Lack of Transportation (Non-Medical): Yes  Physical Activity: Sufficiently Active (10/11/2022)   Exercise Vital Sign    Days of Exercise per Week: 7 days    Minutes of Exercise per Session: 30 min  Stress: No Stress Concern Present (10/11/2022)   [employer] of Occupational Health - Occupational Stress Questionnaire    Feeling of Stress : Not at all  Social Connections: Moderately Integrated (07/14/2024)   Social Connection and Isolation Panel    Frequency of Communication with Friends and Family: More than three times a week    Frequency of Social Gatherings with Friends and Family: More than three times a week    Attends Religious Services: 1 to 4 times per year    Active Member of [employer] or Organizations: Yes    Attends Banker Meetings: More than 4 times per year    Marital Status: Widowed  Intimate Partner Violence: Not At Risk (07/14/2024)   Epic    Fear of Current or Ex-Partner: No    Emotionally Abused: No    Physically Abused: No    Sexually Abused: No  Depression (PHQ2-9): Low Risk (02/07/2024)   Depression (PHQ2-9)    PHQ-2 Score: 0  Alcohol Screen: Not on file  Housing: Low Risk (07/14/2024)   Epic    Unable to Pay for Housing in the Last Year: No    Number of Times Moved in the Last Year: 0    Homeless in the Last Year: No  Utilities: At  Risk (07/14/2024)   Epic    Threatened with loss of utilities: Yes  Health Literacy: Not on file    Review of Systems: Gen: + Fatigue. Denies fever, sweats or chills. No weight loss.  CV: Denies chest pain, palpitations or edema. Resp: Denies cough, shortness of breath of hemoptysis.  GI: See HPI. GU: + Increased urinary frequency.  MS: Denies joint pain, muscles aches or weakness. Derm: Denies rash, itchiness, skin lesions or unhealing ulcers. Psych: Denies depression, anxiety, memory loss or confusion. Heme: Denies easy bruising, bleeding. Neuro:  Denies headaches, dizziness or paresthesias. Endo:  Denies any problems with DM, thyroid  or adrenal function.  Physical Exam: Vital signs in last 24 hours: Temp:  [98.1 F (36.7 C)-99.1 F (37.3 C)] 98.3 F (36.8 C) (12/30 0244) Pulse Rate:  [69-90] 69 (12/30 0244) Resp:  [17-18] 18 (12/30 0244) BP: (116-136)/(63-85) 121/69 (12/30 0244) SpO2:  [96 %-98 %] 96 % (12/30 0244) Last BM Date : 07/11/24 General:  Alert and-year-old female in no acute distress, hard of hearing, hearing aids not in use. Head:  Normocephalic and atraumatic. Eyes:  No scleral icterus. Conjunctiva pink. Ears:  Normal auditory acuity. Nose:  No deformity, discharge or lesions. Mouth:  Dentition intact. No ulcers or lesions.  Neck:  Supple. No lymphadenopathy or thyromegaly.  Lungs: Breath sounds clear throughout. No wheezes, rhonchi or crackles.  Heart: Regular rate and rhythm, no murmurs. Abdomen: Soft, nondistended.  Nontender.  Positive bowel sounds to all 4 quadrants.  No palpable mass. Rectal: Not inflamed circumferential external hemorrhoids.  Rectal vault with thick dark red blood without palpable mass.  Internal hemorrhoids without prolapse.  Nurse tech at the bedside at time of exam. Musculoskeletal:  Symmetrical without gross deformities.  Pulses:  Normal pulses noted. Extremities:  Without clubbing or edema. Neurologic:  Alert and  oriented x 4.  No focal deficits.  Skin:  Intact without significant lesions or rashes. Psych:  Alert and cooperative. Normal mood and affect.  Intake/Output from previous day: 12/29 0701 - 12/30 0700 In: 95.4 [IV Piggyback:95.4] Out: -  Intake/Output this shift: No intake/output data recorded.  Lab Results: Recent Labs    07/12/24 2058 07/13/24 1250 07/14/24 0545  WBC 6.6 9.3 8.0  HGB 12.9 12.7 11.0*  HCT 40.0 39.4 33.8*  PLT 233 236 207   BMET Recent Labs    07/12/24 2058 07/13/24 1250 07/14/24 0544  NA 140 140 141  K 3.6 4.4 3.7  CL 104 105 107  CO2 27 25 25   GLUCOSE 101* 137* 101*  BUN 23 19 19   CREATININE 0.99 0.97 0.99  CALCIUM  9.4 10.0 9.3   LFT Recent Labs    07/14/24 0544  PROT 5.9*  ALBUMIN 3.7  AST 27  ALT 13  ALKPHOS 83  BILITOT 0.4   PT/INR No results for input(s): LABPROT, INR in the last 72 hours. Hepatitis Panel No results for input(s): HEPBSAG, HCVAB, HEPAIGM, HEPBIGM in the last 72 hours.    Studies/Results: CT ANGIO GI BLEED Result Date: 07/13/2024 EXAM: CTA ABDOMEN AND PELVIS WITH CONTRAST 07/13/2024 04:20:31 PM TECHNIQUE: CTA images of the abdomen and pelvis with intravenous contrast. 80 mL of iohexol  (OMNIPAQUE ) 350 MG/ML injection 100 mL IOHEXOL  350 MG/ML SOLN was administered. Three-dimensional MIP/volume rendered formations were performed. Automated exposure control, iterative reconstruction, and/or weight based adjustment of the mA/kV was utilized to reduce the radiation dose to as low as reasonably achievable. COMPARISON: None available. CLINICAL HISTORY: Lower GI bleed (Ped 0-17y). FINDINGS: VASCULATURE: GI BLEED: There is no active gastrointestinal bleeding identified. AORTA: There is mild calcified atherosclerotic disease throughout the aorta. No acute finding. No abdominal aortic aneurysm. No dissection. CELIAC TRUNK: No acute finding. No occlusion or significant stenosis. SUPERIOR MESENTERIC ARTERY: No acute finding. No occlusion  or significant stenosis. INFERIOR MESENTERIC ARTERY: No acute finding. No occlusion or significant stenosis. RENAL ARTERIES: No acute finding. No occlusion or significant stenosis. ILIAC ARTERIES: No acute finding. No occlusion or significant stenosis. ABDOMEN/PELVIS: LOWER CHEST: Visualized portion of the lower chest demonstrates no acute abnormality. LIVER: The liver is unremarkable. GALLBLADDER AND BILE DUCTS: Gallbladder is unremarkable. No biliary ductal dilatation. SPLEEN: The spleen is unremarkable. PANCREAS: The pancreas is unremarkable. ADRENAL GLANDS: Bilateral adrenal glands demonstrate no acute abnormality. KIDNEYS, URETERS AND BLADDER: There is a subcentimeter cyst in the right kidney. The bladder wall is mildly thickened with surrounding inflammation. No stones in the kidneys or ureters. No hydronephrosis. No perinephric or periureteral stranding. GI AND BOWEL: Stomach and duodenal sweep demonstrate no acute abnormality. Sigmoid colon diverticulosis. The appendix is not seen. There is no bowel obstruction. No abnormal bowel wall thickening or distension. REPRODUCTIVE: The uterus is surgically absent. PERITONEUM AND RETROPERITONEUM: No ascites or free air. LYMPH NODES: No lymphadenopathy. BONES AND SOFT TISSUES: Degenerative changes of the spine. No acute abnormality of the bones. No acute soft tissue abnormality. IMPRESSION: 1. No active gastrointestinal bleeding identified. 2. Mildly thickened urinary bladder wall with surrounding inflammation, suspicious for cystitis. 3. Sigmoid colon diverticulosis without evidence of diverticulitis. 4. Subcentimeter right renal cyst, benign with no follow-up imaging recommended. Electronically signed by: Greig Pique MD 07/13/2024 06:24 PM EST RP Workstation: [id number]    IMPRESSION/PLAN:  89 year old female admitted with painless hematochezia. Hg 12.9 -> 12.7 -> 11.0. CTA negative for active GI bleeding, showed sigmoid diverticulosis without evidence of  diverticulitis. Suspect diverticular bleed.  No active hematochezia since admission, rectal exam today showed dark red blood in the rectal vault.  Hemodynamically stable. - Clear liquid diet - Continue supportive measures - No plans for invasive endoscopic evaluation at this time - Continue to monitor the patient closely for active GI bleed - Transfuse for hemoglobin level less than 7 or as needed if symptomatic - CBC in a.m. - Await further recommendations per Dr. San  UTI, on Ceftriaxone  IV daily. - Management per the hospitalist  Constipation  - Miralax Q HS PRN to avoid straining, start at time of discharge   Ruth Porter  07/14/2024, 10:40 AM    Attending physician's note   I have taken a history, reviewed the chart, and examined the patient. I performed a substantive portion of this encounter, including complete performance of at least one of the key components, in conjunction with the APP. I agree with the APP's note, impression, and recommendations with my edits.   89 year old female with medical history as outlined above, presents with painless hematochezia.  No prior similar symptoms.  Baseline hemoglobin  ~12.5.  Slight downtrend since admission with hemoglobin 11 this morning.  Otherwise normal renal function.  CTA with sigmoid diverticulosis, but no extravasation.  Otherwise hemodynamically stable.  Clinical presentation, labs, and imaging, all consistent with self-limiting diverticular bleed.  Discussed pathophysiology of diverticulosis along with full DDx for bleeding with patient and daughters at bedside.  - Continue trending serial CBCs - Monitor for signs of rebleeding.  I would expect some blood still in her stool which would hopefully represent continued luminal clearance rather than active bleed - If concern for active, brisk bleeding, recommend repeat CTA - UTI management per primary Hospital service - If no further bleeding and H/H remains  stable, hopefully discharge in the next 24 hours - Liquid diet   Ruth Dambrosia, DO, FACG [phone] office   A total of 55 minutes of time was spent on this encounter, including in depth chart review, independent review of results as outlined above, communicating results with the patient directly, face-to-face time with the patient, coordinating care, and ordering studies and medications as appropriate, and documentation.          "

## 2024-07-14 NOTE — Plan of Care (Signed)
" °  Problem: Education: Goal: Knowledge of General Education information will improve Description: Including pain rating scale, medication(s)/side effects and non-pharmacologic comfort measures 07/14/2024 0439 by Trudy Lorelie BROCKS, RN Outcome: Progressing 07/14/2024 0439 by Trudy Lorelie BROCKS, RN Outcome: Progressing   Problem: Health Behavior/Discharge Planning: Goal: Ability to manage health-related needs will improve Outcome: Progressing   Problem: Clinical Measurements: Goal: Ability to maintain clinical measurements within normal limits will improve Outcome: Progressing Goal: Will remain free from infection Outcome: Progressing Goal: Diagnostic test results will improve Outcome: Progressing Goal: Respiratory complications will improve Outcome: Progressing Goal: Cardiovascular complication will be avoided Outcome: Progressing   Problem: Activity: Goal: Risk for activity intolerance will decrease Outcome: Progressing   Problem: Nutrition: Goal: Adequate nutrition will be maintained Outcome: Progressing   Problem: Coping: Goal: Level of anxiety will decrease Outcome: Progressing   Problem: Elimination: Goal: Will not experience complications related to bowel motility Outcome: Progressing Goal: Will not experience complications related to urinary retention Outcome: Progressing   Problem: Pain Managment: Goal: General experience of comfort will improve and/or be controlled Outcome: Progressing   Problem: Safety: Goal: Ability to remain free from injury will improve Outcome: Progressing   Problem: Skin Integrity: Goal: Risk for impaired skin integrity will decrease Outcome: Progressing   "

## 2024-07-14 NOTE — Progress Notes (Signed)
 " PROGRESS NOTE   Ruth Porter  FMW:[id number] DOB: 04-Nov-1932 DOA: 07/13/2024 PCP: [name], NP   Brief Narrative:  Care started prior to midnight in the emergency room and she is admitted early this morning after midnight by Dr. Lamar Dess and I am in current agreement with this assessment and plan.  Additional changes to the plan of care have been made accordingly.  The patient is an elderly 89 year old Caucasian female with a past medical history significant for but not limited to anxiety, hyperlipidemia, hypothyroidism, GERD, internal hemorrhoids and diverticulosis who presented to the ED with large volume of dark red blood per the rectum.  She underwent CTA bleeding scan this was negative for active GI bleeding but did show sigmoid diverticulosis without evidence of diverticulitis as well as a thickened urinary bladder and subcentimeter right renal cyst.    Given her painless hematochezia she was admitted for observation and the GI team has been consulted and recommending a clear liquid diet as well as supportive care.  There are no current plans for invasive endoscopic procedure at this time but the GI team recommends to continue monitor closely overnight to ensure no further drop in hemoglobin or active bleeding noted.  They are currently recommending transfusing for hemoglobin less than 7 or as needed if symptomatic.  Blood count has now dropped to 11.0 33.8 and will need to continue monitor for further signs of bleeding but she states that she has not had any more bloody bowel movements.  Will continue to monitor the patient's clinical response to intervention and repeat blood work in the a.m. and follow-up on the specialist recommendations.   DVT prophylaxis: SCDs    Code Status: Full Code Family Communication: D/w Daughter @ bedside  Disposition Plan:  Level of care: Telemetry Status is: Observation The patient remains OBS appropriate and will d/c before 2  midnights.   Consultants:  Gastroenterology  Procedures:  As delineated as above  Antimicrobials:  Anti-infectives (From admission, onward)    Start     Dose/Rate Route Frequency Ordered Stop   07/14/24 1700  cefTRIAXone  (ROCEPHIN ) 1 g in sodium chloride 0.9 % 100 mL IVPB        1 g 200 mL/hr over 30 Minutes Intravenous Every 24 hours 07/14/24 0105 07/19/24 1659   07/13/24 1715  cefTRIAXone  (ROCEPHIN ) 1 g in sodium chloride 0.9 % 100 mL IVPB        1 g 200 mL/hr over 30 Minutes Intravenous  Once 07/13/24 1703 07/13/24 1757       Objective: Vitals:   07/13/24 2214 07/14/24 0244 07/14/24 1024 07/14/24 1344  BP: 116/63 121/69 135/80 132/82  Pulse: 84 69 69 64  Resp: 18 18    Temp: 98.1 F (36.7 C) 98.3 F (36.8 C) 98 F (36.7 C) 98.1 F (36.7 C)  TempSrc: Oral Oral Oral Oral  SpO2: 98% 96% 97% 98%    Intake/Output Summary (Last 24 hours) at 07/14/2024 1440 Last data filed at 07/13/2024 1757 Gross per 24 hour  Intake 95.37 ml  Output --  Net 95.37 ml   There were no vitals filed for this visit.  Data Reviewed: I have personally reviewed following labs and imaging studies  CBC: Recent Labs  Lab 07/12/24 2058 07/13/24 1250 07/14/24 0545  WBC 6.6 9.3 8.0  NEUTROABS  --  7.7  --   HGB 12.9 12.7 11.0*  HCT 40.0 39.4 33.8*  MCV 84.9 83.7 83.9  PLT 233 236 207  Basic Metabolic Panel: Recent Labs  Lab 07/12/24 2058 07/13/24 1250 07/14/24 0544  NA 140 140 141  K 3.6 4.4 3.7  CL 104 105 107  CO2 27 25 25   GLUCOSE 101* 137* 101*  BUN 23 19 19   CREATININE 0.99 0.97 0.99  CALCIUM  9.4 10.0 9.3  MG  --   --  2.4  PHOS  --   --  3.3   GFR: CrCl cannot be calculated (Unknown ideal weight.). Liver Function Tests: Recent Labs  Lab 07/13/24 1250 07/14/24 0544  AST 27 27  ALT 17 13  ALKPHOS 99 83  BILITOT 0.8 0.4  PROT 6.9 5.9*  ALBUMIN 4.3 3.7   Recent Labs  Lab 07/13/24 1250  LIPASE 55*   No results for input(s): AMMONIA in the last 168  hours. Coagulation Profile: No results for input(s): INR, PROTIME in the last 168 hours. Cardiac Enzymes: No results for input(s): CKTOTAL, CKMB, CKMBINDEX, TROPONINI in the last 168 hours. BNP (last 3 results) No results for input(s): PROBNP in the last 8760 hours. HbA1C: No results for input(s): HGBA1C in the last 72 hours. CBG: No results for input(s): GLUCAP in the last 168 hours. Lipid Profile: No results for input(s): CHOL, HDL, LDLCALC, TRIG, CHOLHDL, LDLDIRECT in the last 72 hours. Thyroid  Function Tests: No results for input(s): TSH, T4TOTAL, FREET4, T3FREE, THYROIDAB in the last 72 hours. Anemia Panel: Recent Labs    07/14/24 0544  VITAMINB12 535  FOLATE 14.8  FERRITIN 68  TIBC 241*  IRON 29  RETICCTPCT 1.1   Sepsis Labs: No results for input(s): PROCALCITON, LATICACIDVEN in the last 168 hours.  No results found for this or any previous visit (from the past 240 hours).   Radiology Studies: CT ANGIO GI BLEED Result Date: 07/13/2024 EXAM: CTA ABDOMEN AND PELVIS WITH CONTRAST 07/13/2024 04:20:31 PM TECHNIQUE: CTA images of the abdomen and pelvis with intravenous contrast. 80 mL of iohexol  (OMNIPAQUE ) 350 MG/ML injection 100 mL IOHEXOL  350 MG/ML SOLN was administered. Three-dimensional MIP/volume rendered formations were performed. Automated exposure control, iterative reconstruction, and/or weight based adjustment of the mA/kV was utilized to reduce the radiation dose to as low as reasonably achievable. COMPARISON: None available. CLINICAL HISTORY: Lower GI bleed (Ped 0-17y). FINDINGS: VASCULATURE: GI BLEED: There is no active gastrointestinal bleeding identified. AORTA: There is mild calcified atherosclerotic disease throughout the aorta. No acute finding. No abdominal aortic aneurysm. No dissection. CELIAC TRUNK: No acute finding. No occlusion or significant stenosis. SUPERIOR MESENTERIC ARTERY: No acute finding. No occlusion or  significant stenosis. INFERIOR MESENTERIC ARTERY: No acute finding. No occlusion or significant stenosis. RENAL ARTERIES: No acute finding. No occlusion or significant stenosis. ILIAC ARTERIES: No acute finding. No occlusion or significant stenosis. ABDOMEN/PELVIS: LOWER CHEST: Visualized portion of the lower chest demonstrates no acute abnormality. LIVER: The liver is unremarkable. GALLBLADDER AND BILE DUCTS: Gallbladder is unremarkable. No biliary ductal dilatation. SPLEEN: The spleen is unremarkable. PANCREAS: The pancreas is unremarkable. ADRENAL GLANDS: Bilateral adrenal glands demonstrate no acute abnormality. KIDNEYS, URETERS AND BLADDER: There is a subcentimeter cyst in the right kidney. The bladder wall is mildly thickened with surrounding inflammation. No stones in the kidneys or ureters. No hydronephrosis. No perinephric or periureteral stranding. GI AND BOWEL: Stomach and duodenal sweep demonstrate no acute abnormality. Sigmoid colon diverticulosis. The appendix is not seen. There is no bowel obstruction. No abnormal bowel wall thickening or distension. REPRODUCTIVE: The uterus is surgically absent. PERITONEUM AND RETROPERITONEUM: No ascites or free air. LYMPH NODES: No lymphadenopathy.  BONES AND SOFT TISSUES: Degenerative changes of the spine. No acute abnormality of the bones. No acute soft tissue abnormality. IMPRESSION: 1. No active gastrointestinal bleeding identified. 2. Mildly thickened urinary bladder wall with surrounding inflammation, suspicious for cystitis. 3. Sigmoid colon diverticulosis without evidence of diverticulitis. 4. Subcentimeter right renal cyst, benign with no follow-up imaging recommended. Electronically signed by: Greig Pique MD 07/13/2024 06:24 PM EST RP Workstation: [id number]   Scheduled Meds:  aspirin  EC  81 mg Oral Daily   escitalopram   5 mg Oral Daily   levothyroxine   50 mcg Oral QAC breakfast   Continuous Infusions:  cefTRIAXone  (ROCEPHIN )  IV      LOS: 1 day    Alejandro Marker, DO [facility] Available via Epic secure chat 7am-7pm After these hours, please refer to coverage provider listed on [email or web address] 07/14/2024, 2:40 PM  "

## 2024-07-15 ENCOUNTER — Other Ambulatory Visit (HOSPITAL_COMMUNITY): Payer: Self-pay

## 2024-07-15 DIAGNOSIS — K5731 Diverticulosis of large intestine without perforation or abscess with bleeding: Secondary | ICD-10-CM | POA: Diagnosis not present

## 2024-07-15 DIAGNOSIS — K625 Hemorrhage of anus and rectum: Secondary | ICD-10-CM | POA: Diagnosis not present

## 2024-07-15 LAB — COMPREHENSIVE METABOLIC PANEL WITH GFR
ALT: 13 U/L (ref 0–44)
AST: 22 U/L (ref 15–41)
Albumin: 3.6 g/dL (ref 3.5–5.0)
Alkaline Phosphatase: 83 U/L (ref 38–126)
Anion gap: 8 (ref 5–15)
BUN: 15 mg/dL (ref 8–23)
CO2: 27 mmol/L (ref 22–32)
Calcium: 9.4 mg/dL (ref 8.9–10.3)
Chloride: 106 mmol/L (ref 98–111)
Creatinine, Ser: 0.91 mg/dL (ref 0.44–1.00)
GFR, Estimated: >60 mL/min
Glucose, Bld: 92 mg/dL (ref 70–99)
Potassium: 4.2 mmol/L (ref 3.5–5.1)
Sodium: 141 mmol/L (ref 135–145)
Total Bilirubin: 0.5 mg/dL (ref 0.0–1.2)
Total Protein: 6.1 g/dL — ABNORMAL LOW (ref 6.5–8.1)

## 2024-07-15 LAB — CBC WITH DIFFERENTIAL/PLATELET
Abs Immature Granulocytes: 0 K/uL (ref 0.00–0.07)
Basophils Absolute: 0 K/uL (ref 0.0–0.1)
Basophils Relative: 0 %
Eosinophils Absolute: 0.2 K/uL (ref 0.0–0.5)
Eosinophils Relative: 5 %
HCT: 35.8 % — ABNORMAL LOW (ref 36.0–46.0)
Hemoglobin: 11.6 g/dL — ABNORMAL LOW (ref 12.0–15.0)
Immature Granulocytes: 0 %
Lymphocytes Relative: 27 %
Lymphs Abs: 1.4 K/uL (ref 0.7–4.0)
MCH: 27.3 pg (ref 26.0–34.0)
MCHC: 32.4 g/dL (ref 30.0–36.0)
MCV: 84.2 fL (ref 80.0–100.0)
Monocytes Absolute: 0.4 K/uL (ref 0.1–1.0)
Monocytes Relative: 9 %
Neutro Abs: 3 K/uL (ref 1.7–7.7)
Neutrophils Relative %: 59 %
Platelets: 216 K/uL (ref 150–400)
RBC: 4.25 MIL/uL (ref 3.87–5.11)
RDW: 14.9 % (ref 11.5–15.5)
WBC: 5.1 K/uL (ref 4.0–10.5)
nRBC: 0 % (ref 0.0–0.2)

## 2024-07-15 LAB — PHOSPHORUS: Phosphorus: 3.3 mg/dL (ref 2.5–4.6)

## 2024-07-15 LAB — MAGNESIUM: Magnesium: 2.5 mg/dL — ABNORMAL HIGH (ref 1.7–2.4)

## 2024-07-15 MED ORDER — CEPHALEXIN 500 MG PO CAPS
500.0000 mg | ORAL_CAPSULE | Freq: Three times a day (TID) | ORAL | 0 refills | Status: AC
  Filled 2024-07-15: qty 15, 5d supply, fill #0

## 2024-07-15 NOTE — Discharge Summary (Signed)
 Physician Discharge Summary  Ruth Porter FMW:[id number] DOB: August 09, 1932 DOA: 07/13/2024  PCP: [name], NP  Admit date: 07/13/2024 Discharge date: 07/15/2024  Admitted From: Home Disposition: Home  Recommendations for Outpatient Follow-up:  Follow up with PCP in 1-2 weeks Please obtain BMP/CBC in one week   Home Health: N/A Equipment/Devices: N/A  Discharge Condition: Stable CODE STATUS: Full code Diet recommendation: Regular diet  Discharge summary: 89 year old with history of anxiety, hyperlipidemia, hypothyroidism, internal hemorrhoids and diverticulosis presented to the ER with large volume of dark red blood per rectum x 1, urinalysis with large amount of leukocytes.  She initially came to the hospital, went back home and returned again with another episode of dark red blood with mucus.  Acute lower GI bleeding, known diverticulosis. Slight drop in hemoglobin after hemodilution but is stable. Monitored in the hospital, remained stable.  She was found to have some old streaks of blood but no active bleeding. Seen and followed by gastroenterology. Bleeding scan on admission with CT angiogram was negative for active bleeding. With overall clinically stability, decided to treat conservatively. She will go home and monitor her symptoms and report if excess bleeding.  Stool softner and miralax to prevent constipation.   Suspected UTI: received one dose of Rocephin . Cultures were not collected. Will prescribe 5 days of keflex.   Stable to discharge home with close outpatient follow up.      Discharge Diagnoses:  Principal Problem:   Rectal bleeding Active Problems:   GI bleeding   Diverticulosis of colon with hemorrhage   Acute blood loss anemia    Discharge Instructions  Discharge Instructions     Diet general   Complete by: As directed    Discharge instructions   Complete by: As directed    Take stool softner or laxatives to avoid constipation   Repeat Blood test in 2 weeks   Increase activity slowly   Complete by: As directed       Allergies as of 07/15/2024       Reactions   Statins Other (See Comments)   Vision changes   Sulfa Antibiotics Other (See Comments)   Pt allergic to sulfa but does not know reaction        Medication List     TAKE these medications    aspirin  EC 81 MG tablet Take 81 mg by mouth daily as needed for moderate pain (pain score 4-6).   cephALEXin 500 MG capsule Commonly known as: KEFLEX Take 1 capsule (500 mg total) by mouth 3 (three) times daily for 5 days.   escitalopram  5 MG tablet Commonly known as: Lexapro  Take 1 tablet (5 mg total) by mouth daily. What changed:  when to take this reasons to take this   ibuprofen 200 MG tablet Commonly known as: ADVIL Take 200-800 mg by mouth every 6 (six) hours as needed for moderate pain (pain score 4-6).   levothyroxine  50 MCG tablet Commonly known as: SYNTHROID  Take 1 tablet (50 mcg total) by mouth daily before breakfast. TAKE 1 TABLET(50 MCG) BY MOUTH DAILY.   MAGNESIUM PO Take 1 tablet by mouth daily.   OVER THE COUNTER MEDICATION Take 1 tablet by mouth daily. IQBlast   PRESERVISION/LUTEIN PO Take 1 capsule by mouth 2 (two) times daily.   CENTRUM ULTRA WOMENS PO Take 1 tablet by mouth daily.   Vitamin D 125 MCG (5000 UT) Caps Take 5,000 Units by mouth daily.        Allergies[1]  Consultations: GI  Procedures/Studies: CT ANGIO GI BLEED Result Date: 07/13/2024 EXAM: CTA ABDOMEN AND PELVIS WITH CONTRAST 07/13/2024 04:20:31 PM TECHNIQUE: CTA images of the abdomen and pelvis with intravenous contrast. 80 mL of iohexol  (OMNIPAQUE ) 350 MG/ML injection 100 mL IOHEXOL  350 MG/ML SOLN was administered. Three-dimensional MIP/volume rendered formations were performed. Automated exposure control, iterative reconstruction, and/or weight based adjustment of the mA/kV was utilized to reduce the radiation dose to as low as reasonably  achievable. COMPARISON: None available. CLINICAL HISTORY: Lower GI bleed (Ped 0-88y). FINDINGS: VASCULATURE: GI BLEED: There is no active gastrointestinal bleeding identified. AORTA: There is mild calcified atherosclerotic disease throughout the aorta. No acute finding. No abdominal aortic aneurysm. No dissection. CELIAC TRUNK: No acute finding. No occlusion or significant stenosis. SUPERIOR MESENTERIC ARTERY: No acute finding. No occlusion or significant stenosis. INFERIOR MESENTERIC ARTERY: No acute finding. No occlusion or significant stenosis. RENAL ARTERIES: No acute finding. No occlusion or significant stenosis. ILIAC ARTERIES: No acute finding. No occlusion or significant stenosis. ABDOMEN/PELVIS: LOWER CHEST: Visualized portion of the lower chest demonstrates no acute abnormality. LIVER: The liver is unremarkable. GALLBLADDER AND BILE DUCTS: Gallbladder is unremarkable. No biliary ductal dilatation. SPLEEN: The spleen is unremarkable. PANCREAS: The pancreas is unremarkable. ADRENAL GLANDS: Bilateral adrenal glands demonstrate no acute abnormality. KIDNEYS, URETERS AND BLADDER: There is a subcentimeter cyst in the right kidney. The bladder wall is mildly thickened with surrounding inflammation. No stones in the kidneys or ureters. No hydronephrosis. No perinephric or periureteral stranding. GI AND BOWEL: Stomach and duodenal sweep demonstrate no acute abnormality. Sigmoid colon diverticulosis. The appendix is not seen. There is no bowel obstruction. No abnormal bowel wall thickening or distension. REPRODUCTIVE: The uterus is surgically absent. PERITONEUM AND RETROPERITONEUM: No ascites or free air. LYMPH NODES: No lymphadenopathy. BONES AND SOFT TISSUES: Degenerative changes of the spine. No acute abnormality of the bones. No acute soft tissue abnormality. IMPRESSION: 1. No active gastrointestinal bleeding identified. 2. Mildly thickened urinary bladder wall with surrounding inflammation, suspicious for  cystitis. 3. Sigmoid colon diverticulosis without evidence of diverticulitis. 4. Subcentimeter right renal cyst, benign with no follow-up imaging recommended. Electronically signed by: Greig Pique MD 07/13/2024 06:24 PM EST RP Workstation: [id number]   (Echo, Carotid, EGD, Colonoscopy, ERCP)    Subjective: patient seen and examined. Daughters at the bedside. Patient ate regular food. She had one BM with small amount of old blood. Comfortable with plans to go home. Has appointment with PCP in 2 weeks, can repeat Hb in 2 weeks.   Discharge Exam: Vitals:   07/15/24 0434 07/15/24 1220  BP: 120/67 107/68  Pulse: 66 89  Resp: 18 15  Temp: (!) 97.4 F (36.3 C) 98.3 F (36.8 C)  SpO2: 97% 97%   Vitals:   07/14/24 1344 07/14/24 2024 07/15/24 0434 07/15/24 1220  BP: 132/82 135/73 120/67 107/68  Pulse: 64 62 66 89  Resp:  17 18 15   Temp: 98.1 F (36.7 C) 98.1 F (36.7 C) (!) 97.4 F (36.3 C) 98.3 F (36.8 C)  TempSrc: Oral Oral Oral   SpO2: 98% 99% 97% 97%    General: Pt is alert, awake, not in acute distress Cardiovascular: RRR, S1/S2 +, no rubs, no gallops Respiratory: CTA bilaterally, no wheezing, no rhonchi Abdominal: Soft, NT, ND, bowel sounds + Extremities: no edema, no cyanosis    The results of significant diagnostics from this hospitalization (including imaging, microbiology, ancillary and laboratory) are listed below for reference.     Microbiology: No results found for this or any previous  visit (from the past 240 hours).   Labs: BNP (last 3 results) No results for input(s): BNP in the last 8760 hours. Basic Metabolic Panel: Recent Labs  Lab 07/12/24 2058 07/13/24 1250 07/14/24 0544 07/15/24 0643  NA 140 140 141 141  K 3.6 4.4 3.7 4.2  CL 104 105 107 106  CO2 27 25 25 27   GLUCOSE 101* 137* 101* 92  BUN 23 19 19 15   CREATININE 0.99 0.97 0.99 0.91  CALCIUM  9.4 10.0 9.3 9.4  MG  --   --  2.4 2.5*  PHOS  --   --  3.3 3.3   Liver Function  Tests: Recent Labs  Lab 07/13/24 1250 07/14/24 0544 07/15/24 0643  AST 27 27 22   ALT 17 13 13   ALKPHOS 99 83 83  BILITOT 0.8 0.4 0.5  PROT 6.9 5.9* 6.1*  ALBUMIN 4.3 3.7 3.6   Recent Labs  Lab 07/13/24 1250  LIPASE 55*   No results for input(s): AMMONIA in the last 168 hours. CBC: Recent Labs  Lab 07/12/24 2058 07/13/24 1250 07/14/24 0545 07/15/24 0643  WBC 6.6 9.3 8.0 5.1  NEUTROABS  --  7.7  --  3.0  HGB 12.9 12.7 11.0* 11.6*  HCT 40.0 39.4 33.8* 35.8*  MCV 84.9 83.7 83.9 84.2  PLT 233 236 207 216   Cardiac Enzymes: No results for input(s): CKTOTAL, CKMB, CKMBINDEX, TROPONINI in the last 168 hours. BNP: Invalid input(s): POCBNP CBG: No results for input(s): GLUCAP in the last 168 hours. D-Dimer No results for input(s): DDIMER in the last 72 hours. Hgb A1c No results for input(s): HGBA1C in the last 72 hours. Lipid Profile No results for input(s): CHOL, HDL, LDLCALC, TRIG, CHOLHDL, LDLDIRECT in the last 72 hours. Thyroid  function studies No results for input(s): TSH, T4TOTAL, T3FREE, THYROIDAB in the last 72 hours.  Invalid input(s): FREET3 Anemia work up Recent Labs    07/14/24 0544  VITAMINB12 535  FOLATE 14.8  FERRITIN 68  TIBC 241*  IRON 29  RETICCTPCT 1.1   Urinalysis    Component Value Date/Time   COLORURINE YELLOW 07/13/2024 1250   APPEARANCEUR CLOUDY (A) 07/13/2024 1250   LABSPEC 1.023 07/13/2024 1250   PHURINE 5.5 07/13/2024 1250   GLUCOSEU NEGATIVE 07/13/2024 1250   GLUCOSEU NEGATIVE 09/11/2007 1142   HGBUR LARGE (A) 07/13/2024 1250   BILIRUBINUR NEGATIVE 07/13/2024 1250   KETONESUR NEGATIVE 07/13/2024 1250   PROTEINUR 100 (A) 07/13/2024 1250   UROBILINOGEN 0.2 mg/dL 97/73/7990 8857   NITRITE POSITIVE (A) 07/13/2024 1250   LEUKOCYTESUR LARGE (A) 07/13/2024 1250   Sepsis Labs Recent Labs  Lab 07/12/24 2058 07/13/24 1250 07/14/24 0545 07/15/24 0643  WBC 6.6 9.3 8.0 5.1    Microbiology No results found for this or any previous visit (from the past 240 hours).   Time coordinating discharge: 32 minutes  SIGNED:   Renato Applebaum, MD  [facility] 07/15/2024, 3:02 PM     [1]  Allergies Allergen Reactions   Statins Other (See Comments)    Vision changes   Sulfa Antibiotics Other (See Comments)    Pt allergic to sulfa but does not know reaction

## 2024-07-15 NOTE — Plan of Care (Signed)

## 2024-07-15 NOTE — Progress Notes (Signed)
 Late entry: 1545:Patient had been discharged, call to patient & daughter in car, updated on Keflex prescription for home. Discharge med in a secure bag delivered to patient at main entrance by this RN at [phone]

## 2024-07-15 NOTE — Progress Notes (Signed)
 [facility] ROUNDING NOTE   Subjective: No acute events overnight.  Scant dark blood mixed in with stool x 1, but no overt active bleeding.  Hemoglobin uptrending.  Feels well and she is hopeful for discharge home today.   Objective: Vital signs in last 24 hours: Temp:  [97.4 F (36.3 C)-98.1 F (36.7 C)] 97.4 F (36.3 C) (12/31 0434) Pulse Rate:  [62-69] 66 (12/31 0434) Resp:  [17-18] 18 (12/31 0434) BP: (120-135)/(67-82) 120/67 (12/31 0434) SpO2:  [97 %-99 %] 97 % (12/31 0434) Last BM Date : 07/11/24 General: NAD Abdomen:  Soft, NT, ND, +BS   Intake/Output from previous day: No intake/output data recorded. Intake/Output this shift: No intake/output data recorded.   Lab Results: Recent Labs    07/13/24 1250 07/14/24 0545 07/15/24 0643  WBC 9.3 8.0 5.1  HGB 12.7 11.0* 11.6*  PLT 236 207 216  MCV 83.7 83.9 84.2   BMET Recent Labs    07/12/24 2058 07/13/24 1250 07/14/24 0544  NA 140 140 141  K 3.6 4.4 3.7  CL 104 105 107  CO2 27 25 25   GLUCOSE 101* 137* 101*  BUN 23 19 19   CREATININE 0.99 0.97 0.99  CALCIUM  9.4 10.0 9.3   LFT Recent Labs    07/13/24 1250 07/14/24 0544  PROT 6.9 5.9*  ALBUMIN 4.3 3.7  AST 27 27  ALT 17 13  ALKPHOS 99 83  BILITOT 0.8 0.4   PT/INR No results for input(s): INR in the last 72 hours.    Imaging/Other results: CT ANGIO GI BLEED Result Date: 07/13/2024 EXAM: CTA ABDOMEN AND PELVIS WITH CONTRAST 07/13/2024 04:20:31 PM TECHNIQUE: CTA images of the abdomen and pelvis with intravenous contrast. 80 mL of iohexol  (OMNIPAQUE ) 350 MG/ML injection 100 mL IOHEXOL  350 MG/ML SOLN was administered. Three-dimensional MIP/volume rendered formations were performed. Automated exposure control, iterative reconstruction, and/or weight based adjustment of the mA/kV was utilized to reduce the radiation dose to as low as reasonably achievable. COMPARISON: None available. CLINICAL HISTORY: Lower GI bleed (Ped 0-17y). FINDINGS:  VASCULATURE: GI BLEED: There is no active gastrointestinal bleeding identified. AORTA: There is mild calcified atherosclerotic disease throughout the aorta. No acute finding. No abdominal aortic aneurysm. No dissection. CELIAC TRUNK: No acute finding. No occlusion or significant stenosis. SUPERIOR MESENTERIC ARTERY: No acute finding. No occlusion or significant stenosis. INFERIOR MESENTERIC ARTERY: No acute finding. No occlusion or significant stenosis. RENAL ARTERIES: No acute finding. No occlusion or significant stenosis. ILIAC ARTERIES: No acute finding. No occlusion or significant stenosis. ABDOMEN/PELVIS: LOWER CHEST: Visualized portion of the lower chest demonstrates no acute abnormality. LIVER: The liver is unremarkable. GALLBLADDER AND BILE DUCTS: Gallbladder is unremarkable. No biliary ductal dilatation. SPLEEN: The spleen is unremarkable. PANCREAS: The pancreas is unremarkable. ADRENAL GLANDS: Bilateral adrenal glands demonstrate no acute abnormality. KIDNEYS, URETERS AND BLADDER: There is a subcentimeter cyst in the right kidney. The bladder wall is mildly thickened with surrounding inflammation. No stones in the kidneys or ureters. No hydronephrosis. No perinephric or periureteral stranding. GI AND BOWEL: Stomach and duodenal sweep demonstrate no acute abnormality. Sigmoid colon diverticulosis. The appendix is not seen. There is no bowel obstruction. No abnormal bowel wall thickening or distension. REPRODUCTIVE: The uterus is surgically absent. PERITONEUM AND RETROPERITONEUM: No ascites or free air. LYMPH NODES: No lymphadenopathy. BONES AND SOFT TISSUES: Degenerative changes of the spine. No acute abnormality of the bones. No acute soft tissue abnormality. IMPRESSION: 1. No active gastrointestinal bleeding identified. 2. Mildly thickened urinary bladder wall with surrounding  inflammation, suspicious for cystitis. 3. Sigmoid colon diverticulosis without evidence of diverticulitis. 4. Subcentimeter right  renal cyst, benign with no follow-up imaging recommended. Electronically signed by: Greig Pique MD 07/13/2024 06:24 PM EST RP Workstation: [id number]      Assessment and Plan:  89 year old female admitted with painless hematochezia. Hg 12.9 -> 12.7 -> 11.0. CTA negative for active GI bleeding, showed sigmoid diverticulosis without evidence of diverticulitis.   Hemoglobin uptrending today at 11.6 and no signs of further active bleeding.  Did have some blood mixed in stool overnight x 1, but based on description suspect this is clearance of old luminal blood.  Otherwise HD stable.  Clinical presentation all consistent with self-limiting diverticular bleed.  - Advance diet - Can discharge home today from GI standpoint - UTI management per primary Hospitalist service - Can repeat CBC with PCP in 7-10 days to ensure returning to baseline hemoglobin of ~12.5 - Can follow-up in the GI clinic on an as-needed basis - Inpatient GI service will sign off at this time    Sandor LULLA Flatter, DO  07/15/2024, 8:17 AM [facility] Pager [phone]

## 2024-07-15 NOTE — Plan of Care (Signed)
" °  Problem: Education: Goal: Knowledge of General Education information will improve Description: Including pain rating scale, medication(s)/side effects and non-pharmacologic comfort measures 07/15/2024 1446 by Orestes Geiman C, RN Outcome: Adequate for Discharge 07/15/2024 0834 by Braxden Lovering C, RN Outcome: Progressing   Problem: Health Behavior/Discharge Planning: Goal: Ability to manage health-related needs will improve 07/15/2024 1446 by Jasimine Simms C, RN Outcome: Adequate for Discharge 07/15/2024 0834 by Jacki Couse C, RN Outcome: Progressing   Problem: Clinical Measurements: Goal: Ability to maintain clinical measurements within normal limits will improve 07/15/2024 1446 by Aarav Burgett C, RN Outcome: Adequate for Discharge 07/15/2024 0834 by Gordon Carolyn BROCKS, RN Outcome: Progressing Goal: Will remain free from infection 07/15/2024 1446 by Kaitrin Seybold C, RN Outcome: Adequate for Discharge 07/15/2024 0834 by Fayola Meckes C, RN Outcome: Progressing Goal: Diagnostic test results will improve 07/15/2024 1446 by Mayjor Ager C, RN Outcome: Adequate for Discharge 07/15/2024 0834 by Ayumi Wangerin C, RN Outcome: Progressing Goal: Respiratory complications will improve 07/15/2024 1446 by Zaden Sako C, RN Outcome: Adequate for Discharge 07/15/2024 0834 by Majour Frei C, RN Outcome: Progressing Goal: Cardiovascular complication will be avoided 07/15/2024 1446 by Lundy Cozart C, RN Outcome: Adequate for Discharge 07/15/2024 0834 by Brandilynn Taormina C, RN Outcome: Progressing   Problem: Activity: Goal: Risk for activity intolerance will decrease 07/15/2024 1446 by Dayanne Yiu C, RN Outcome: Adequate for Discharge 07/15/2024 0834 by Gordon Carolyn BROCKS, RN Outcome: Progressing   Problem: Nutrition: Goal: Adequate nutrition will be maintained 07/15/2024 1446 by Jayley Hustead C, RN Outcome: Adequate for Discharge 07/15/2024 0834  by Gordon Carolyn BROCKS, RN Outcome: Progressing   Problem: Coping: Goal: Level of anxiety will decrease 07/15/2024 1446 by Gordon Carolyn BROCKS, RN Outcome: Adequate for Discharge 07/15/2024 0834 by Gordon Carolyn BROCKS, RN Outcome: Progressing   Problem: Elimination: Goal: Will not experience complications related to bowel motility 07/15/2024 1446 by Toris Laverdiere C, RN Outcome: Adequate for Discharge 07/15/2024 0834 by Sumer Moorehouse C, RN Outcome: Progressing Goal: Will not experience complications related to urinary retention 07/15/2024 1446 by Agapita Savarino C, RN Outcome: Adequate for Discharge 07/15/2024 0834 by Emerlyn Mehlhoff C, RN Outcome: Progressing   Problem: Pain Managment: Goal: General experience of comfort will improve and/or be controlled 07/15/2024 1446 by Yarethzy Croak C, RN Outcome: Adequate for Discharge 07/15/2024 0834 by Karelyn Brisby C, RN Outcome: Progressing   Problem: Safety: Goal: Ability to remain free from injury will improve 07/15/2024 1446 by Brycelynn Stampley C, RN Outcome: Adequate for Discharge 07/15/2024 0834 by Emersen Mascari C, RN Outcome: Progressing   Problem: Skin Integrity: Goal: Risk for impaired skin integrity will decrease 07/15/2024 1446 by Ranada Vigorito C, RN Outcome: Adequate for Discharge 07/15/2024 0834 by Keely Drennan C, RN Outcome: Progressing   "

## 2024-07-15 NOTE — Plan of Care (Signed)

## 2024-08-11 ENCOUNTER — Ambulatory Visit: Payer: Medicare (Managed Care) | Admitting: Family
